# Patient Record
Sex: Female | Born: 1975 | Race: White | Hispanic: No | Marital: Single | Smoking: Current every day smoker
Health system: Southern US, Community
[De-identification: ages and names within clinical notes are randomized; demographics above are authoritative.]

## PROBLEM LIST (undated history)

## (undated) ENCOUNTER — Emergency Department (HOSPITAL_BASED_OUTPATIENT_CLINIC_OR_DEPARTMENT_OTHER): Admission: EM | Payer: Medicaid Other | Source: Home / Self Care

## (undated) DIAGNOSIS — G8929 Other chronic pain: Secondary | ICD-10-CM

## (undated) DIAGNOSIS — K219 Gastro-esophageal reflux disease without esophagitis: Secondary | ICD-10-CM

## (undated) DIAGNOSIS — M1990 Unspecified osteoarthritis, unspecified site: Secondary | ICD-10-CM

## (undated) DIAGNOSIS — M797 Fibromyalgia: Secondary | ICD-10-CM

## (undated) DIAGNOSIS — M549 Dorsalgia, unspecified: Secondary | ICD-10-CM

## (undated) DIAGNOSIS — G43909 Migraine, unspecified, not intractable, without status migrainosus: Secondary | ICD-10-CM

## (undated) HISTORY — PX: OTHER SURGICAL HISTORY: SHX169

## (undated) HISTORY — PX: TUBAL LIGATION: SHX77

## (undated) HISTORY — PX: APPENDECTOMY: SHX54

---

## 1998-01-10 ENCOUNTER — Emergency Department (HOSPITAL_COMMUNITY): Admission: EM | Admit: 1998-01-10 | Discharge: 1998-01-10 | Payer: Self-pay | Admitting: Emergency Medicine

## 1998-03-22 ENCOUNTER — Emergency Department (HOSPITAL_COMMUNITY): Admission: EM | Admit: 1998-03-22 | Discharge: 1998-03-22 | Payer: Self-pay | Admitting: Emergency Medicine

## 1998-06-04 ENCOUNTER — Emergency Department (HOSPITAL_COMMUNITY): Admission: EM | Admit: 1998-06-04 | Discharge: 1998-06-04 | Payer: Self-pay | Admitting: Emergency Medicine

## 1998-07-12 ENCOUNTER — Emergency Department (HOSPITAL_COMMUNITY): Admission: EM | Admit: 1998-07-12 | Discharge: 1998-07-13 | Payer: Self-pay | Admitting: Emergency Medicine

## 1998-07-13 ENCOUNTER — Encounter: Payer: Self-pay | Admitting: Family Medicine

## 1998-07-13 ENCOUNTER — Ambulatory Visit (HOSPITAL_COMMUNITY): Admission: RE | Admit: 1998-07-13 | Discharge: 1998-07-13 | Payer: Self-pay | Admitting: Family Medicine

## 1999-03-18 ENCOUNTER — Inpatient Hospital Stay (HOSPITAL_COMMUNITY): Admission: AD | Admit: 1999-03-18 | Discharge: 1999-03-18 | Payer: Self-pay | Admitting: Obstetrics

## 1999-09-19 ENCOUNTER — Emergency Department (HOSPITAL_COMMUNITY): Admission: EM | Admit: 1999-09-19 | Discharge: 1999-09-19 | Payer: Self-pay | Admitting: *Deleted

## 1999-09-24 ENCOUNTER — Emergency Department (HOSPITAL_COMMUNITY): Admission: EM | Admit: 1999-09-24 | Discharge: 1999-09-24 | Payer: Self-pay | Admitting: Emergency Medicine

## 1999-11-02 ENCOUNTER — Emergency Department (HOSPITAL_COMMUNITY): Admission: EM | Admit: 1999-11-02 | Discharge: 1999-11-02 | Payer: Self-pay | Admitting: Emergency Medicine

## 1999-11-17 ENCOUNTER — Emergency Department (HOSPITAL_COMMUNITY): Admission: EM | Admit: 1999-11-17 | Discharge: 1999-11-17 | Payer: Self-pay | Admitting: Emergency Medicine

## 2000-08-08 ENCOUNTER — Inpatient Hospital Stay (HOSPITAL_COMMUNITY): Admission: AD | Admit: 2000-08-08 | Discharge: 2000-08-08 | Payer: Self-pay | Admitting: *Deleted

## 2000-09-09 ENCOUNTER — Emergency Department (HOSPITAL_COMMUNITY): Admission: EM | Admit: 2000-09-09 | Discharge: 2000-09-09 | Payer: Self-pay | Admitting: *Deleted

## 2000-12-13 ENCOUNTER — Inpatient Hospital Stay (HOSPITAL_COMMUNITY): Admission: AD | Admit: 2000-12-13 | Discharge: 2000-12-13 | Payer: Self-pay | Admitting: Obstetrics

## 2018-04-09 ENCOUNTER — Emergency Department (HOSPITAL_BASED_OUTPATIENT_CLINIC_OR_DEPARTMENT_OTHER)
Admission: EM | Admit: 2018-04-09 | Discharge: 2018-04-09 | Disposition: A | Discharge status: Home / Self Care | Payer: Medicaid Other | Attending: Emergency Medicine | Admitting: Emergency Medicine

## 2018-04-09 ENCOUNTER — Encounter (HOSPITAL_BASED_OUTPATIENT_CLINIC_OR_DEPARTMENT_OTHER): Payer: Self-pay | Admitting: Emergency Medicine

## 2018-04-09 ENCOUNTER — Other Ambulatory Visit: Payer: Self-pay

## 2018-04-09 DIAGNOSIS — R519 Headache, unspecified: Secondary | ICD-10-CM

## 2018-04-09 DIAGNOSIS — R51 Headache: Secondary | ICD-10-CM | POA: Diagnosis not present

## 2018-04-09 DIAGNOSIS — R0981 Nasal congestion: Secondary | ICD-10-CM | POA: Diagnosis not present

## 2018-04-09 DIAGNOSIS — Z79899 Other long term (current) drug therapy: Secondary | ICD-10-CM | POA: Diagnosis not present

## 2018-04-09 DIAGNOSIS — F17210 Nicotine dependence, cigarettes, uncomplicated: Secondary | ICD-10-CM | POA: Diagnosis not present

## 2018-04-09 HISTORY — DX: Other chronic pain: G89.29

## 2018-04-09 HISTORY — DX: Migraine, unspecified, not intractable, without status migrainosus: G43.909

## 2018-04-09 HISTORY — DX: Dorsalgia, unspecified: M54.9

## 2018-04-09 MED ORDER — DIPHENHYDRAMINE HCL 25 MG PO CAPS
25.0000 mg | ORAL_CAPSULE | Freq: Once | ORAL | Status: AC
  Administered 2018-04-09: 25 mg via ORAL
  Filled 2018-04-09: qty 1

## 2018-04-09 MED ORDER — IBUPROFEN 400 MG PO TABS
600.0000 mg | ORAL_TABLET | Freq: Once | ORAL | Status: AC
  Administered 2018-04-09: 600 mg via ORAL
  Filled 2018-04-09: qty 1

## 2018-04-09 MED ORDER — ONDANSETRON 8 MG PO TBDP: 8.0000 mg | ORAL_TABLET | Freq: Three times a day (TID) | ORAL | 0 refills | Status: DC | PRN

## 2018-04-09 MED ORDER — IBUPROFEN 600 MG PO TABS: 600.0000 mg | ORAL_TABLET | Freq: Three times a day (TID) | ORAL | 0 refills | Status: AC | PRN

## 2018-04-09 NOTE — ED Notes (Signed)
Pt/family verbalized understanding of discharge instructions.   

## 2018-04-09 NOTE — ED Provider Notes (Signed)
**Note Beth-Identified via Obfuscation** [facility] EMERGENCY DEPARTMENT Provider Note   CSN: [id number] Arrival date & time: 04/09/18  0808     History   Chief Complaint Chief Complaint  Patient presents with  . Headache    HPI Beth Porter is a 42 y.o. female.  Patient with hx migraines, c/o onset dull frontal headache 2-3 days ago. Pain gradual onset, constant, persistent, moderate, feels like prior headaches. No acute or abrupt worsening today. +nausea. No vomiting. No eye pain or change in vision. No numbness/weakness. No change in speech. No change in normal functional ability. Denies any recent head trauma. No syncope. No fever or chills. +recent nasal/sinus congestion. No sore throat. Has not taken any meds today.   The history is provided by the patient.  Headache   Associated symptoms include nausea. Pertinent negatives include no fever, no shortness of breath and no vomiting.    Past Medical History:  Diagnosis Date  . Chronic back pain   . Migraine          OB History   None      Home Medications    Prior to Admission medications   Medication Sig Start Date End Date Taking? Authorizing Provider  HYDROcodone-acetaminophen (NORCO) 10-325 MG tablet Take by mouth. 03/30/18 04/29/18 Yes [provider]  naloxone Jonelle Sports(NARCAN) 2 MG/2ML injection For suspected opioid overdose, spray 1 mL in each nostril.  Repeat after 3 minutes if no or minimal response. 01/24/17  Yes [provider]  SUMAtriptan (IMITREX) 100 MG tablet Take by mouth. 01/09/17  Yes [provider]  morphine (MSIR) 30 MG tablet Take by mouth. 04/10/18 05/10/18  [provider]    Family History No family history on file.  Social History Social History   Tobacco Use  . Smoking status: Current Every Day Smoker  . Smokeless tobacco: Never Used  Substance Use Topics  . Alcohol use: Not on file  . Drug use: Not on file     Allergies   Aspirin and Metronidazole   Review of  Systems Review of Systems  Constitutional: Negative for fever.  HENT: Positive for congestion and rhinorrhea. Negative for sore throat.   Eyes: Negative for pain, redness and visual disturbance.  Respiratory: Negative for shortness of breath.   Cardiovascular: Negative for chest pain.  Gastrointestinal: Positive for nausea. Negative for abdominal pain and vomiting.  Genitourinary: Negative for flank pain.  Musculoskeletal: Negative for back pain, neck pain and neck stiffness.  Skin: Negative for rash.  Neurological: Positive for headaches. Negative for syncope.  Hematological: Does not bruise/bleed easily.  Psychiatric/Behavioral: Negative for confusion.     Physical Exam Updated Vital Signs BP (!) 158/93 (BP Location: Right Arm)   Pulse 85   Temp 98 F (36.7 C) (Oral)   Resp 18   Ht 1.473 m (4\' 10" )   Wt 52.6 kg   LMP 04/03/2018   SpO2 98%   BMI 24.24 kg/m   Physical Exam  Constitutional: She is oriented to person, place, and time. She appears well-developed and well-nourished.  HENT:  Head: Atraumatic.  Nose: Nose normal.  Mouth/Throat: Oropharynx is clear and moist.  No sinus or temporal tenderness. +nasal congestion.   Eyes: Pupils are equal, round, and reactive to light. Conjunctivae and EOM are normal. No scleral icterus.  Neck: Neck supple. No tracheal deviation present. No thyromegaly present.  No stiffness or rigidity.   Cardiovascular: Normal rate, regular rhythm, normal heart sounds and intact distal pulses. Exam reveals no gallop  and no friction rub.  No murmur heard. Pulmonary/Chest: Effort normal and breath sounds normal. No respiratory distress.  Abdominal: Soft. Normal appearance and bowel sounds are normal. She exhibits no distension. There is no tenderness.  Genitourinary:  Genitourinary Comments: No cva tenderness.  Musculoskeletal: Normal range of motion. She exhibits no edema or tenderness.  Neurological: She is alert and oriented to person, place,  and time. No cranial nerve deficit.  Speech clear/fluent. Motor/sens grossly intact bil. Steady gait.   Skin: Skin is warm and dry. No rash noted. She is not diaphoretic.  Psychiatric: She has a normal mood and affect.  Nursing note and vitals reviewed.    ED Treatments / Results  Labs (all labs ordered are listed, but only abnormal results are displayed) Labs Reviewed - No data to display  EKG None  Radiology No results found.  Procedures Procedures (including critical care time)  Medications Ordered in ED Medications  ibuprofen (ADVIL,MOTRIN) tablet 600 mg (600 mg Oral Given 04/09/18 0915)  diphenhydrAMINE (BENADRYL) capsule 25 mg (25 mg Oral Given 04/09/18 0916)     Initial Impression / Assessment and Plan / ED Course  I have reviewed the triage vital signs and the nursing notes.  Pertinent labs & imaging results that were available during my care of the patient were reviewed by me and considered in my medical decision making (see chart for details).  No meds yet today.  Motrin po. Benadryl po.  Reviewed nursing notes and prior charts for additional history.   Recheck, pt reports feeling much improved.   Pt currently appears stable for d/c.     Final Clinical Impressions(s) / ED Diagnoses   Final diagnoses:  None    ED Discharge Orders    None       Cathren Laine, MD 04/09/18 [phone]

## 2018-04-09 NOTE — ED Triage Notes (Signed)
Headache with nausea since Friday.  Took one dose Imitrex which helped some but still continues.  Pt states she is on pain contract and hasn't been able to take her pain medication due to nausea.

## 2018-04-09 NOTE — Discharge Instructions (Addendum)
It was our pleasure to provide your ER care today - we hope that you feel better.  Take motrin as need for pain - take with food. Take zofran as need for nausea.  For sinus congestion - take zyrtec-d or claritin-d as need (available over the counter).  Follow up with primary care doctor in 1 week if symptoms fail to improve/resolve.

## 2018-04-14 ENCOUNTER — Other Ambulatory Visit: Payer: Self-pay

## 2018-04-14 ENCOUNTER — Encounter (HOSPITAL_BASED_OUTPATIENT_CLINIC_OR_DEPARTMENT_OTHER): Payer: Self-pay | Admitting: Emergency Medicine

## 2018-04-14 ENCOUNTER — Emergency Department (HOSPITAL_BASED_OUTPATIENT_CLINIC_OR_DEPARTMENT_OTHER)
Admission: EM | Admit: 2018-04-14 | Discharge: 2018-04-14 | Disposition: A | Discharge status: Home / Self Care | Payer: Medicaid Other | Attending: Emergency Medicine | Admitting: Emergency Medicine

## 2018-04-14 DIAGNOSIS — F17200 Nicotine dependence, unspecified, uncomplicated: Secondary | ICD-10-CM | POA: Diagnosis not present

## 2018-04-14 DIAGNOSIS — J069 Acute upper respiratory infection, unspecified: Secondary | ICD-10-CM | POA: Diagnosis not present

## 2018-04-14 DIAGNOSIS — H9201 Otalgia, right ear: Secondary | ICD-10-CM | POA: Diagnosis present

## 2018-04-14 DIAGNOSIS — Z79899 Other long term (current) drug therapy: Secondary | ICD-10-CM | POA: Diagnosis not present

## 2018-04-14 MED ORDER — AEROCHAMBER PLUS FLO-VU MEDIUM MISC
1.0000 | Freq: Once | Status: AC
  Administered 2018-04-14: 1
  Filled 2018-04-14: qty 1

## 2018-04-14 MED ORDER — NEOMYCIN-POLYMYXIN-HC 3.5-10000-1 OT SUSP: 4.0000 [drp] | Freq: Three times a day (TID) | OTIC | 0 refills | Status: DC

## 2018-04-14 MED ORDER — ALBUTEROL SULFATE HFA 108 (90 BASE) MCG/ACT IN AERS
1.0000 | INHALATION_SPRAY | Freq: Once | RESPIRATORY_TRACT | Status: AC
  Administered 2018-04-14: 1 via RESPIRATORY_TRACT
  Filled 2018-04-14: qty 6.7

## 2018-04-14 MED ORDER — FLUTICASONE PROPIONATE 50 MCG/ACT NA SUSP: 1.0000 | Freq: Every day | NASAL | 2 refills | Status: DC

## 2018-04-14 MED ORDER — BENZONATATE 100 MG PO CAPS: 200.0000 mg | ORAL_CAPSULE | Freq: Three times a day (TID) | ORAL | 0 refills | Status: DC

## 2018-04-14 NOTE — ED Notes (Signed)
Patient verbalizes understanding of discharge instructions. Opportunity for questioning and answers were provided. Armband removed by staff, pt discharged from ED to her home via POV.  

## 2018-04-14 NOTE — ED Provider Notes (Signed)
[facility] EMERGENCY DEPARTMENT Provider Note   CSN: [id number] Arrival date & time: 04/14/18  1757     History   Chief Complaint Chief Complaint  Patient presents with  . Otalgia    HPI Beth Porter is a 42 y.o. female who presents to ED for evaluation of 1 day history of right-sided otalgia.  She also reports several day history of sinus congestion, dry cough, mild wheezing.  States that she experiences similar symptoms around this time of year or several times a year.  In the past she has had to use an inhaler to help with her wheezing.  She has a history of a ruptured TM on the right side several years ago due to a viral infection.  She denies any chest pain, shortness of breath, hemoptysis, fever, drainage from the ear, recent swimming, trauma to the area.  HPI  Past Medical History:  Diagnosis Date  . Chronic back pain   . Migraine     There are no active problems to display for this patient.   Past Surgical History:  Procedure Laterality Date  . abdominal laproscopy x 2    . APPENDECTOMY    . CESAREAN SECTION     x 3  . TUBAL LIGATION       OB History   None      Home Medications    Prior to Admission medications   Medication Sig Start Date End Date Taking? Authorizing Provider  benzonatate (TESSALON) 100 MG capsule Take 2 capsules (200 mg total) by mouth every 8 (eight) hours. 04/14/18   Ross Bender, PA-C  fluticasone (FLONASE) 50 MCG/ACT nasal spray Place 1 spray into both nostrils daily. 04/14/18   Flavius Repsher, PA-C  HYDROcodone-acetaminophen (NORCO) 10-325 MG tablet Take by mouth. 03/30/18 04/29/18  [provider]  ibuprofen (ADVIL,MOTRIN) 600 MG tablet Take 1 tablet (600 mg total) by mouth every 8 (eight) hours as needed. Take with food. 04/09/18   Cathren Laine, MD  morphine (MSIR) 30 MG tablet Take by mouth. 04/10/18 05/10/18  [provider]  naloxone Jonelle Sports) 2 MG/2ML injection For suspected opioid overdose, spray 1 mL  in each nostril.  Repeat after 3 minutes if no or minimal response. 01/24/17   [provider]  neomycin-polymyxin-hydrocortisone (CORTISPORIN) 3.5-10000-1 OTIC suspension Place 4 drops into the right ear 3 (three) times daily. 04/14/18   Shayon Trompeter, PA-C  ondansetron (ZOFRAN ODT) 8 MG disintegrating tablet Take 1 tablet (8 mg total) by mouth every 8 (eight) hours as needed for nausea or vomiting. 04/09/18   Cathren Laine, MD  SUMAtriptan (IMITREX) 100 MG tablet Take by mouth. 01/09/17   [provider]    Family History No family history on file.  Social History Social History   Tobacco Use  . Smoking status: Current Every Day Smoker  . Smokeless tobacco: Never Used  Substance Use Topics  . Alcohol use: Yes  . Drug use: Never     Allergies   Aspirin and Metronidazole   Review of Systems Review of Systems  Constitutional: Negative for chills and fever.  HENT: Positive for ear pain, postnasal drip, rhinorrhea and sinus pressure. Negative for ear discharge, facial swelling, sinus pain, sore throat, tinnitus and trouble swallowing.   Respiratory: Positive for cough and wheezing. Negative for shortness of breath.   Cardiovascular: Negative for chest pain.     Physical Exam Updated Vital Signs BP (!) 135/93   Pulse 86   Temp 98.5 F (36.9  C) (Oral)   Resp 18   Ht 4\' 10"  (1.473 m)   Wt 52.6 kg   LMP 04/03/2018   SpO2 96%   BMI 24.24 kg/m   Physical Exam  Constitutional: She appears well-developed and well-nourished. No distress.  HENT:  Head: Normocephalic and atraumatic.  Right Ear: A middle ear effusion is present.  Left Ear: A middle ear effusion is present.  Nose: Nose normal.  Mouth/Throat: Uvula is midline and oropharynx is clear and moist.  Eyes: Conjunctivae and EOM are normal. No scleral icterus.  Neck: Normal range of motion.  Cardiovascular: Normal rate and regular rhythm.  Pulmonary/Chest: Effort normal. No respiratory distress. She has  wheezes (Faint expiratory wheezing noted in bilateral lung fields.).  Neurological: She is alert.  Skin: No rash noted. She is not diaphoretic.  Psychiatric: She has a normal mood and affect.  Nursing note and vitals reviewed.    ED Treatments / Results  Labs (all labs ordered are listed, but only abnormal results are displayed) Labs Reviewed - No data to display  EKG None  Radiology No results found.  Procedures Procedures (including critical care time)  Medications Ordered in ED Medications  albuterol (PROVENTIL HFA;VENTOLIN HFA) 108 (90 Base) MCG/ACT inhaler 1 puff (1 puff Inhalation Given 04/14/18 1922)  AEROCHAMBER PLUS FLO-VU MEDIUM MISC 1 each (1 each Other Given 04/14/18 1923)     Initial Impression / Assessment and Plan / ED Course  I have reviewed the triage vital signs and the nursing notes.  Pertinent labs & imaging results that were available during my care of the patient were reviewed by me and considered in my medical decision making (see chart for details).     42 year old female presents to ED for 1 day history of right-sided otalgia.  He also reports several day history of dry cough, nasal congestion, wheezing.  She endorses similar symptoms around this time of year or several times a year.  On exam there is bilateral TM effusions noted with no other concerning findings.  Faint wheezing noted in bilateral lung fields.  Patient states is usually improved with a few puffs of her inhaler.  Denies any fever, chest pain, shortness of breath, and is from the ear.  Will treat for viral URI and given inhaler here.  Cortisporin drops as needed for discomfort advised to return to ED for any severe worsening symptoms.  Portions of this note were generated with Scientist, clinical (histocompatibility and immunogenetics). Dictation errors may occur despite best attempts at proofreading.   Final Clinical Impressions(s) / ED Diagnoses   Final diagnoses:  Viral upper respiratory tract infection    ED  Discharge Orders         Ordered    neomycin-polymyxin-hydrocortisone (CORTISPORIN) 3.5-10000-1 OTIC suspension  3 times daily     04/14/18 1920    benzonatate (TESSALON) 100 MG capsule  Every 8 hours     04/14/18 1920    fluticasone (FLONASE) 50 MCG/ACT nasal spray  Daily     04/14/18 1920           Dietrich Pates, PA-C 04/14/18 1926    Tegeler, Canary Brim, MD 04/15/18 [phone]

## 2018-04-14 NOTE — ED Triage Notes (Signed)
Pt c/o B/L ear pain R>L onset today. Pt reports sinus issues for past week. Pt reports has a history of ruptured ear drum in right ear. Pt has tried over the counter medication without relief.

## 2018-04-14 NOTE — Discharge Instructions (Signed)
Return to ED for worsening symptoms, trouble breathing or trouble swallowing, chest pain, vomiting or coughing up blood. °

## 2018-11-11 ENCOUNTER — Other Ambulatory Visit: Payer: Self-pay

## 2018-11-11 ENCOUNTER — Emergency Department (HOSPITAL_BASED_OUTPATIENT_CLINIC_OR_DEPARTMENT_OTHER): Payer: Medicaid Other

## 2018-11-11 ENCOUNTER — Encounter (HOSPITAL_BASED_OUTPATIENT_CLINIC_OR_DEPARTMENT_OTHER): Payer: Self-pay | Admitting: *Deleted

## 2018-11-11 ENCOUNTER — Emergency Department (HOSPITAL_BASED_OUTPATIENT_CLINIC_OR_DEPARTMENT_OTHER)
Admission: EM | Admit: 2018-11-11 | Discharge: 2018-11-11 | Disposition: A | Discharge status: Home / Self Care | Payer: Medicaid Other | Attending: Emergency Medicine | Admitting: Emergency Medicine

## 2018-11-11 DIAGNOSIS — F17210 Nicotine dependence, cigarettes, uncomplicated: Secondary | ICD-10-CM | POA: Insufficient documentation

## 2018-11-11 DIAGNOSIS — G894 Chronic pain syndrome: Secondary | ICD-10-CM | POA: Insufficient documentation

## 2018-11-11 DIAGNOSIS — R112 Nausea with vomiting, unspecified: Secondary | ICD-10-CM

## 2018-11-11 DIAGNOSIS — Z79899 Other long term (current) drug therapy: Secondary | ICD-10-CM | POA: Insufficient documentation

## 2018-11-11 DIAGNOSIS — R110 Nausea: Secondary | ICD-10-CM | POA: Diagnosis present

## 2018-11-11 DIAGNOSIS — R1011 Right upper quadrant pain: Secondary | ICD-10-CM | POA: Diagnosis not present

## 2018-11-11 HISTORY — DX: Gastro-esophageal reflux disease without esophagitis: K21.9

## 2018-11-11 LAB — COMPREHENSIVE METABOLIC PANEL
ALT: 20 U/L (ref 0–44)
AST: 16 U/L (ref 15–41)
Albumin: 4.4 g/dL (ref 3.5–5.0)
Alkaline Phosphatase: 88 U/L (ref 38–126)
Anion gap: 7 (ref 5–15)
BUN: 10 mg/dL (ref 6–20)
CO2: 22 mmol/L (ref 22–32)
Calcium: 9.1 mg/dL (ref 8.9–10.3)
Chloride: 108 mmol/L (ref 98–111)
Creatinine, Ser: 0.6 mg/dL (ref 0.44–1.00)
GFR calc Af Amer: >60 mL/min (ref >60)
GFR calc non Af Amer: >60 mL/min (ref >60)
Glucose, Bld: 130 mg/dL — ABNORMAL HIGH (ref 70–99)
Potassium: 3.8 mmol/L (ref 3.5–5.1)
Sodium: 137 mmol/L (ref 135–145)
Total Bilirubin: 0.3 mg/dL (ref 0.3–1.2)
Total Protein: 8.1 g/dL (ref 6.5–8.1)

## 2018-11-11 LAB — CBC WITH DIFFERENTIAL/PLATELET
Abs Immature Granulocytes: 0.06 10*3/uL (ref 0.00–0.07)
Basophils Absolute: 0.1 10*3/uL (ref 0.0–0.1)
Basophils Relative: 0 %
Eosinophils Absolute: 0.1 10*3/uL (ref 0.0–0.5)
Eosinophils Relative: 1 %
HCT: 48.4 % — ABNORMAL HIGH (ref 36.0–46.0)
Hemoglobin: 16 g/dL — ABNORMAL HIGH (ref 12.0–15.0)
Immature Granulocytes: 0 %
Lymphocytes Relative: 26 %
Lymphs Abs: 3.7 10*3/uL (ref 0.7–4.0)
MCH: 28.1 pg (ref 26.0–34.0)
MCHC: 33.1 g/dL (ref 30.0–36.0)
MCV: 84.9 fL (ref 80.0–100.0)
Monocytes Absolute: 0.6 10*3/uL (ref 0.1–1.0)
Monocytes Relative: 4 %
Neutro Abs: 9.9 10*3/uL — ABNORMAL HIGH (ref 1.7–7.7)
Neutrophils Relative %: 69 %
Platelets: 427 10*3/uL — ABNORMAL HIGH (ref 150–400)
RBC: 5.7 MIL/uL — ABNORMAL HIGH (ref 3.87–5.11)
RDW: 14 % (ref 11.5–15.5)
WBC: 14.4 10*3/uL — ABNORMAL HIGH (ref 4.0–10.5)
nRBC: 0 % (ref 0.0–0.2)

## 2018-11-11 LAB — LIPASE, BLOOD: Lipase: 33 U/L (ref 11–51)

## 2018-11-11 MED ORDER — PANTOPRAZOLE SODIUM 40 MG IV SOLR
40.0000 mg | Freq: Once | INTRAVENOUS | Status: AC
  Administered 2018-11-11: 40 mg via INTRAVENOUS
  Filled 2018-11-11: qty 40

## 2018-11-11 MED ORDER — MORPHINE SULFATE (PF) 4 MG/ML IV SOLN
6.0000 mg | Freq: Once | INTRAVENOUS | Status: AC
  Administered 2018-11-11: 6 mg via INTRAVENOUS
  Filled 2018-11-11: qty 2

## 2018-11-11 MED ORDER — ALUM & MAG HYDROXIDE-SIMETH 200-200-20 MG/5ML PO SUSP
30.0000 mL | Freq: Once | ORAL | Status: AC
  Administered 2018-11-11: 30 mL via ORAL
  Filled 2018-11-11: qty 30

## 2018-11-11 MED ORDER — PROMETHAZINE HCL 25 MG PO TABS
25.0000 mg | ORAL_TABLET | Freq: Once | ORAL | Status: AC
  Administered 2018-11-11: 17:00:00 25 mg via ORAL
  Filled 2018-11-11: qty 1

## 2018-11-11 MED ORDER — SODIUM CHLORIDE 0.9 % IV BOLUS
500.0000 mL | Freq: Once | INTRAVENOUS | Status: AC
  Administered 2018-11-11: 15:00:00 500 mL via INTRAVENOUS

## 2018-11-11 MED ORDER — ONDANSETRON HCL 4 MG/2ML IJ SOLN
4.0000 mg | Freq: Once | INTRAMUSCULAR | Status: AC
  Administered 2018-11-11: 15:00:00 4 mg via INTRAVENOUS
  Filled 2018-11-11: qty 2

## 2018-11-11 MED ORDER — PANTOPRAZOLE SODIUM 40 MG PO TBEC: 40.0000 mg | DELAYED_RELEASE_TABLET | Freq: Every day | ORAL | 0 refills | Status: DC

## 2018-11-11 MED ORDER — LIDOCAINE VISCOUS HCL 2 % MT SOLN
15.0000 mL | Freq: Once | OROMUCOSAL | Status: AC
  Administered 2018-11-11: 15:00:00 15 mL via ORAL
  Filled 2018-11-11: qty 15

## 2018-11-11 MED ORDER — MORPHINE SULFATE (PF) 4 MG/ML IV SOLN
4.0000 mg | Freq: Once | INTRAVENOUS | Status: DC
  Filled 2018-11-11: qty 1

## 2018-11-11 MED ORDER — PROMETHAZINE HCL 25 MG PO TABS: 25.0000 mg | ORAL_TABLET | Freq: Four times a day (QID) | ORAL | 0 refills | Status: DC | PRN

## 2018-11-11 MED ORDER — SUCRALFATE 1 GM/10ML PO SUSP: 1.0000 g | Freq: Three times a day (TID) | ORAL | 0 refills | Status: DC

## 2018-11-11 NOTE — ED Notes (Signed)
Pt states she is going thru withdrawal because she is unable to take pain medication d/tt nausea that she states is r/t having GERD.

## 2018-11-11 NOTE — ED Provider Notes (Signed)
[facility] EMERGENCY DEPARTMENT Provider Note   CSN: [id number] Arrival date & time: 11/11/18  1338    History   Chief Complaint Chief Complaint  Patient presents with  . Nausea    HPI Beth Porter is a 43 y.o. female with history of chronic back pain, GERD who presents with nausea and intermittent vomiting for the past 4 days.  Patient reports she has had this in the past related to her GERD.  She does not currently take any medication for GERD.  She has not been able to tolerate her oral pain medications of morphine and hydrocodone.  She denies any significant abdominal pain, chest pain, shortness of breath, diarrhea, fever.  Patient reports she has had her associated chronic back and hip pain that has become worse because she cannot take her pain medication.  She called her doctor who was unable to call anything in due to a prior authorization.  Patient denies any saddle anesthesia, bowel/bladder incontinence, history of IVDU, known cancer, recent spinal injection.     HPI  Past Medical History:  Diagnosis Date  . Chronic back pain   . GERD (gastroesophageal reflux disease)   . Migraine     There are no active problems to display for this patient.   Past Surgical History:  Procedure Laterality Date  . abdominal laproscopy x 2    . APPENDECTOMY    . CESAREAN SECTION     x 3  . TUBAL LIGATION       OB History   No obstetric history on file.      Home Medications    Prior to Admission medications   Medication Sig Start Date End Date Taking? Authorizing Provider  HYDROcodone-acetaminophen (NORCO) 10-325 MG tablet Take by mouth. 10/21/18 11/20/18 Yes [provider]  benzonatate (TESSALON) 100 MG capsule Take 2 capsules (200 mg total) by mouth every 8 (eight) hours. 04/14/18   Khatri, Hina, PA-C  fluticasone (FLONASE) 50 MCG/ACT nasal spray Place 1 spray into both nostrils daily. 04/14/18   Khatri, Hina, PA-C  ibuprofen (ADVIL,MOTRIN) 600 MG tablet  Take 1 tablet (600 mg total) by mouth every 8 (eight) hours as needed. Take with food. 04/09/18   Cathren Laine, MD  morphine (KADIAN) 50 MG 24 hr capsule Take by mouth.    [provider]  naloxone Jonelle Sports) 2 MG/2ML injection For suspected opioid overdose, spray 1 mL in each nostril.  Repeat after 3 minutes if no or minimal response. 01/24/17   [provider]  neomycin-polymyxin-hydrocortisone (CORTISPORIN) 3.5-10000-1 OTIC suspension Place 4 drops into the right ear 3 (three) times daily. 04/14/18   Khatri, Hina, PA-C  ondansetron (ZOFRAN ODT) 8 MG disintegrating tablet Take 1 tablet (8 mg total) by mouth every 8 (eight) hours as needed for nausea or vomiting. 04/09/18   Cathren Laine, MD  pantoprazole (PROTONIX) 40 MG tablet Take 1 tablet (40 mg total) by mouth daily. 11/11/18   Laurian Edrington, Waylan Boga, PA-C  promethazine (PHENERGAN) 25 MG tablet Take 1 tablet (25 mg total) by mouth every 6 (six) hours as needed for nausea or vomiting. 11/11/18   Kol Consuegra, Waylan Boga, PA-C  sucralfate (CARAFATE) 1 GM/10ML suspension Take 10 mLs (1 g total) by mouth 4 (four) times daily -  with meals and at bedtime. 11/11/18   Emi Holes, PA-C  SUMAtriptan (IMITREX) 100 MG tablet Take by mouth. 01/09/17   [provider]    Family History No family history on file.  Social  History Social History   Tobacco Use  . Smoking status: Current Every Day Smoker    Types: Cigarettes  . Smokeless tobacco: Never Used  Substance Use Topics  . Alcohol use: Yes    Comment: Very Rare  . Drug use: Never     Allergies   Aspirin and Metronidazole   Review of Systems Review of Systems  Constitutional: Negative for chills and fever.  HENT: Negative for facial swelling and sore throat.   Respiratory: Negative for shortness of breath.   Cardiovascular: Negative for chest pain.  Gastrointestinal: Positive for nausea and vomiting (once or twice). Negative for abdominal pain and diarrhea.   Genitourinary: Negative for dysuria.  Musculoskeletal: Positive for back pain (chronic).  Skin: Negative for rash and wound.  Neurological: Negative for headaches.  Psychiatric/Behavioral: The patient is not nervous/anxious.      Physical Exam Updated Vital Signs BP (!) 141/93   Pulse 78   Temp 98.4 F (36.9 C) (Oral)   Resp 18   Ht  (1.473 m)   Wt 56.7 kg   LMP 10/31/2018 (Approximate)   SpO2 98%   BMI 26.13 kg/m   Physical Exam Vitals signs and nursing note reviewed.  Constitutional:      General: She is not in acute distress.    Appearance: She is well-developed. She is not diaphoretic.  HENT:     Head: Normocephalic and atraumatic.     Mouth/Throat:     Pharynx: No oropharyngeal exudate.  Eyes:     General: No scleral icterus.       Right eye: No discharge.        Left eye: No discharge.     Conjunctiva/sclera: Conjunctivae normal.     Pupils: Pupils are equal, round, and reactive to light.  Neck:     Musculoskeletal: Normal range of motion and neck supple.     Thyroid: No thyromegaly.  Cardiovascular:     Rate and Rhythm: Normal rate and regular rhythm.     Heart sounds: Normal heart sounds. No murmur. No friction rub. No gallop.   Pulmonary:     Effort: Pulmonary effort is normal. No respiratory distress.     Breath sounds: Normal breath sounds. No stridor. No wheezing or rales.  Abdominal:     General: Bowel sounds are normal. There is no distension.     Palpations: Abdomen is soft.     Tenderness: There is abdominal tenderness in the right upper quadrant and epigastric area. There is no guarding or rebound. Negative signs include Murphy's sign and McBurney's sign.  Musculoskeletal:     Comments: Very tender on the lumbar spine and bilateral paraspinal muscles, however this is baseline; no erythema  Lymphadenopathy:     Cervical: No cervical adenopathy.  Skin:    General: Skin is warm and dry.     Coloration: Skin is not pale.     Findings: No  rash.  Neurological:     Mental Status: She is alert.     Coordination: Coordination normal.      ED Treatments / Results  Labs (all labs ordered are listed, but only abnormal results are displayed) Labs Reviewed  COMPREHENSIVE METABOLIC PANEL - Abnormal; Notable for the following components:      Result Value   Glucose, Bld 130 (*)    All other components within normal limits  CBC WITH DIFFERENTIAL/PLATELET - Abnormal; Notable for the following components:   WBC 14.4 (*)    RBC 5.70 (*)  Hemoglobin 16.0 (*)    HCT 48.4 (*)    Platelets 427 (*)    Neutro Abs 9.9 (*)    All other components within normal limits  LIPASE, BLOOD    EKG None  Radiology US Abdomen Limited Ruq  Result Date: 11/11/2018 CLINICAL DATA:  Right upper quadrant pain with nausea EXAM: ULTRASOUND ABDOMEN LIMITED RIGHT UPPER QUADRANT COMPARISON:  None. FINDINGS: Gallbladder: No gallstones or wall thickening visualized. No sonographic Murphy sign noted by sonographer. Common bile duct: Diameter: 3 mm Liver: No focal lesion identified. Within normal limits in parenchymal echogenicity. Portal vein is patent on color Doppler imaging with normal direction of blood flow towards the liver. IMPRESSION: Negative right upper quadrant abdominal ultrasound Electronically Signed   By: Jasmine Pang M.D.   On: 11/11/2018 17:58    Procedures Procedures (including critical care time)  Medications Ordered in ED Medications  morphine 4 MG/ML injection 6 mg (has no administration in time range)  sodium chloride 0.9 % bolus 500 mL (0 mLs Intravenous Stopped 11/11/18 1651)  ondansetron (ZOFRAN) injection 4 mg (4 mg Intravenous Given 11/11/18 1526)  alum & mag hydroxide-simeth (MAALOX/MYLANTA) 200-200-20 MG/5ML suspension 30 mL (30 mLs Oral Given 11/11/18 1526)    And  lidocaine (XYLOCAINE) 2 % viscous mouth solution 15 mL (15 mLs Oral Given 11/11/18 1526)  pantoprazole (PROTONIX) injection 40 mg (40 mg Intravenous Given  11/11/18 1526)  morphine 4 MG/ML injection 6 mg (6 mg Intravenous Given 11/11/18 1527)  promethazine (PHENERGAN) tablet 25 mg (25 mg Oral Given 11/11/18 1649)     Initial Impression / Assessment and Plan / ED Course  I have reviewed the triage vital signs and the nursing notes.  Pertinent labs & imaging results that were available during my care of the patient were reviewed by me and considered in my medical decision making (see chart for details).        Patient presenting with a four-day history of nausea and intermittent vomiting.  She has had a burning sensation in her epigastrium. She has very mild tenderness in epigastrium and right upper quadrant.  She has not been able to tolerate her home pain medications.  Labs are unremarkable except for mild leukocytosis of 14.4, however I feel like some of this is hemoconcentrated as hemoglobin is 16.  Right upper quadrant ultrasound is negative.  Patient is afebrile and there are no signs of infection.   Patient reports she has had this happen before related to her GERD.  She is not currently taking anything for her GERD.  Will restart Protonix as well as Carafate.  Patient had better relief with Phenergan and Zofran in the ED, so will discharge home with Phenergan.  Patient able to tolerate oral fluids prior to discharge.  Patient's back pain is baseline and treated with IV morphine in the ED.  Patient has plans to follow-up with her doctor tomorrow.  I also recommended maybe following up with GI, as patient has not had an endoscopy and could have an ulcer as cause of this pain as well.  Return precautions discussed.  Patient understands and agrees with plan.  Patient vital stable throughout ED course and discharged in satisfactory condition.  Final Clinical Impressions(s) / ED Diagnoses   Final diagnoses:  RUQ pain  Nausea & vomiting  Chronic pain syndrome    ED Discharge Orders         Ordered    promethazine (PHENERGAN) 25 MG tablet  Every 6  hours PRN  11/11/18 1806    pantoprazole (PROTONIX) 40 MG tablet  Daily     11/11/18 1806    sucralfate (CARAFATE) 1 GM/10ML suspension  3 times daily with meals & bedtime     11/11/18 1806           Emi HolesLaw, Onetta Spainhower M, PA-C 11/11/18 1809    Tegeler, Canary Brimhristopher J, MD 11/11/18 [phone]

## 2018-11-11 NOTE — Discharge Instructions (Signed)
Resume taking Protonix daily.  Take Carafate as prescribed before eating and before bed.  Take Phenergan every 6 hours as needed for nausea or vomiting.  Please follow-up with your doctor tomorrow as planned.  If this continues, it may be helpful to be referred to a GI doctor for an endoscopy.  Please return to emergency department if you develop any new or worsening symptoms including intractable vomiting, fever of 100.4, localized abdominal pain, black or bloody stools, or any other new or concerning symptoms.

## 2018-11-11 NOTE — ED Notes (Signed)
Pt verbalizes understanding that she has to have a ride before discharge d/t administration of pain medication

## 2018-11-11 NOTE — ED Triage Notes (Signed)
Pt reports nausea since Thursday. She has a history of GERD and chronic pain. States she has not been able to take her meds due to this and is having withdrawal symptoms. She spoke to her pain management doctor who directed her to come to ED

## 2018-12-13 ENCOUNTER — Other Ambulatory Visit: Payer: Self-pay

## 2018-12-13 ENCOUNTER — Emergency Department (HOSPITAL_BASED_OUTPATIENT_CLINIC_OR_DEPARTMENT_OTHER)
Admission: EM | Admit: 2018-12-13 | Discharge: 2018-12-13 | Disposition: A | Discharge status: Home / Self Care | Payer: Medicaid Other | Attending: Emergency Medicine | Admitting: Emergency Medicine

## 2018-12-13 ENCOUNTER — Encounter (HOSPITAL_BASED_OUTPATIENT_CLINIC_OR_DEPARTMENT_OTHER): Payer: Self-pay | Admitting: *Deleted

## 2018-12-13 DIAGNOSIS — W182XXA Fall in (into) shower or empty bathtub, initial encounter: Secondary | ICD-10-CM | POA: Diagnosis not present

## 2018-12-13 DIAGNOSIS — F17210 Nicotine dependence, cigarettes, uncomplicated: Secondary | ICD-10-CM | POA: Diagnosis not present

## 2018-12-13 DIAGNOSIS — Z79899 Other long term (current) drug therapy: Secondary | ICD-10-CM | POA: Insufficient documentation

## 2018-12-13 DIAGNOSIS — Y93E1 Activity, personal bathing and showering: Secondary | ICD-10-CM | POA: Diagnosis not present

## 2018-12-13 DIAGNOSIS — M7918 Myalgia, other site: Secondary | ICD-10-CM | POA: Insufficient documentation

## 2018-12-13 DIAGNOSIS — Y92009 Unspecified place in unspecified non-institutional (private) residence as the place of occurrence of the external cause: Secondary | ICD-10-CM

## 2018-12-13 DIAGNOSIS — Y92002 Bathroom of unspecified non-institutional (private) residence single-family (private) house as the place of occurrence of the external cause: Secondary | ICD-10-CM | POA: Insufficient documentation

## 2018-12-13 DIAGNOSIS — Y999 Unspecified external cause status: Secondary | ICD-10-CM | POA: Insufficient documentation

## 2018-12-13 DIAGNOSIS — M79604 Pain in right leg: Secondary | ICD-10-CM | POA: Diagnosis present

## 2018-12-13 DIAGNOSIS — W19XXXA Unspecified fall, initial encounter: Secondary | ICD-10-CM

## 2018-12-13 MED ORDER — METHOCARBAMOL 500 MG PO TABS: 500.0000 mg | ORAL_TABLET | Freq: Two times a day (BID) | ORAL | 0 refills | Status: DC

## 2018-12-13 MED ORDER — KETOROLAC TROMETHAMINE 30 MG/ML IJ SOLN
30.0000 mg | Freq: Once | INTRAMUSCULAR | Status: AC
  Administered 2018-12-13: 30 mg via INTRAMUSCULAR
  Filled 2018-12-13: qty 1

## 2018-12-13 MED ORDER — DIAZEPAM 5 MG PO TABS
5.0000 mg | ORAL_TABLET | Freq: Once | ORAL | Status: AC
  Administered 2018-12-13: 5 mg via ORAL
  Filled 2018-12-13: qty 1

## 2018-12-13 NOTE — ED Notes (Signed)
ED Provider at bedside. 

## 2018-12-13 NOTE — ED Triage Notes (Signed)
She slipped getting out of the shower this am. She fell at 1:30 while sitting on the edge of the bathtub. States she is in pain management and took her pain medications after the fall. Body aches throughout the day. States she cant take her pain medication for almost 2 hours.

## 2018-12-13 NOTE — ED Provider Notes (Signed)
[facility] EMERGENCY DEPARTMENT Provider Note   CSN: [id number] Arrival date & time: 12/13/18  2053    History   Chief Complaint Chief Complaint  Patient presents with  . Fall    HPI Beth Porter is a 43 y.o. female.     Patient with history of chronic pain and occasional mobility problems. She reports slipping in the bath tub twice today. She struck on her right leg on the tub ledge on one fall, and landed on her buttocks for the second fall. No loss of consciousness. She did not strike her head. She is reporting stiffness of the neck and back, as well as pain to the right buttock and leg. She has taken her chronic pain medication with intermittent relief, but it seems to wear off quickly.   The history is provided by the patient. No language interpreter was used.  Fall  This is a new problem. The current episode started 12 to 24 hours ago. The problem has been gradually worsening.    Past Medical History:  Diagnosis Date  . Chronic back pain   . GERD (gastroesophageal reflux disease)   . Migraine     There are no active problems to display for this patient.   Past Surgical History:  Procedure Laterality Date  . abdominal laproscopy x 2    . APPENDECTOMY    . CESAREAN SECTION     x 3  . TUBAL LIGATION       OB History   No obstetric history on file.      Home Medications    Prior to Admission medications   Medication Sig Start Date End Date Taking? Authorizing Provider  ibuprofen (ADVIL,MOTRIN) 600 MG tablet Take 1 tablet (600 mg total) by mouth every 8 (eight) hours as needed. Take with food. 04/09/18  Yes Cathren Laine, MD  morphine (KADIAN) 50 MG 24 hr capsule Take by mouth.   Yes [provider]  naloxone [facility]) 2 MG/2ML injection For suspected opioid overdose, spray 1 mL in each nostril.  Repeat after 3 minutes if no or minimal response. 01/24/17  Yes [provider]  pantoprazole (PROTONIX) 40 MG tablet Take 1 tablet (40  mg total) by mouth daily. 11/11/18  Yes Law, Waylan Boga, PA-C  SUMAtriptan (IMITREX) 100 MG tablet Take by mouth. 01/09/17  Yes [provider]  benzonatate (TESSALON) 100 MG capsule Take 2 capsules (200 mg total) by mouth every 8 (eight) hours. 04/14/18   Khatri, Hina, PA-C  fluticasone (FLONASE) 50 MCG/ACT nasal spray Place 1 spray into both nostrils daily. 04/14/18   Khatri, Hina, PA-C  neomycin-polymyxin-hydrocortisone (CORTISPORIN) 3.5-10000-1 OTIC suspension Place 4 drops into the right ear 3 (three) times daily. 04/14/18   Khatri, Hina, PA-C  ondansetron (ZOFRAN ODT) 8 MG disintegrating tablet Take 1 tablet (8 mg total) by mouth every 8 (eight) hours as needed for nausea or vomiting. 04/09/18   Cathren Laine, MD  promethazine (PHENERGAN) 25 MG tablet Take 1 tablet (25 mg total) by mouth every 6 (six) hours as needed for nausea or vomiting. 11/11/18   Law, Waylan Boga, PA-C  sucralfate (CARAFATE) 1 GM/10ML suspension Take 10 mLs (1 g total) by mouth 4 (four) times daily -  with meals and at bedtime. 11/11/18   Emi Holes, PA-C    Family History No family history on file.  Social History Social History   Tobacco Use  . Smoking status: Current Every Day Smoker    Types: Cigarettes  .  Smokeless tobacco: Never Used  Substance Use Topics  . Alcohol use: Yes    Comment: Very Rare  . Drug use: Never     Allergies   Aspirin and Metronidazole   Review of Systems Review of Systems  Musculoskeletal: Positive for arthralgias, back pain, myalgias and neck stiffness.  All other systems reviewed and are negative.    Physical Exam Updated Vital Signs BP (!) 142/94   Pulse (!) 104   Temp 98.1 F (36.7 C) (Oral)   Resp 18   Ht 4\' 10"  (1.473 m)   Wt 53.5 kg   SpO2 98%   BMI 24.66 kg/m   Physical Exam Vitals signs and nursing note reviewed.  Constitutional:      Appearance: Normal appearance. She is not toxic-appearing.  HENT:     Head: Atraumatic.  Eyes:      Conjunctiva/sclera: Conjunctivae normal.  Neck:     Musculoskeletal: Normal range of motion and neck supple.     Comments: Mild neck stiffness on exam. Good ROM. No neurologic deficits. Cardiovascular:     Rate and Rhythm: Tachycardia present.  Pulmonary:     Effort: Pulmonary effort is normal.     Breath sounds: Normal breath sounds.  Abdominal:     General: There is no distension.     Palpations: Abdomen is soft.     Tenderness: There is no abdominal tenderness.  Musculoskeletal:        General: Tenderness present. No deformity.     Right hip: She exhibits tenderness. She exhibits no crepitus and no deformity.     Lumbar back: She exhibits spasm. She exhibits no bony tenderness.       Back:       Legs:     Comments: No obvious bruising.  Skin:    General: Skin is warm and dry.  Neurological:     Mental Status: She is alert and oriented to person, place, and time.     Sensory: No sensory deficit.  Psychiatric:        Mood and Affect: Mood normal.      ED Treatments / Results  Labs (all labs ordered are listed, but only abnormal results are displayed) Labs Reviewed - No data to display  EKG None  Radiology No results found.  Procedures Procedures (including critical care time)  Medications Ordered in ED Medications  ketorolac (TORADOL) 30 MG/ML injection 30 mg (30 mg Intramuscular Given 12/13/18 2133)  diazepam (VALIUM) tablet 5 mg (5 mg Oral Given 12/13/18 2133)     Initial Impression / Assessment and Plan / ED Course  I have reviewed the triage vital signs and the nursing notes.  Pertinent labs & imaging results that were available during my care of the patient were reviewed by me and considered in my medical decision making (see chart for details).      Pain improved after medication. Patient resting quietly on bed on reassessment.  Patient with fall at home. No obvious MSK injury. Discomfort in multiple areas. History of chronic pain, and is followed  by pain management. Patient given toradol and valium in ED. Pt advised to follow up with their PCP/PMS. Conservative therapy recommended and discussed. Patient will be discharged home & is agreeable with above plan. Returns precautions discussed. Pt appears safe for discharge.  Final Clinical Impressions(s) / ED Diagnoses   Final diagnoses:  Fall in home, initial encounter  Musculoskeletal pain    ED Discharge Orders    None  Felicie Morn, NP 12/13/18 2223    Vanetta Mulders, MD 12/18/18 [phone]

## 2018-12-15 ENCOUNTER — Encounter (HOSPITAL_BASED_OUTPATIENT_CLINIC_OR_DEPARTMENT_OTHER): Payer: Self-pay | Admitting: *Deleted

## 2018-12-15 ENCOUNTER — Emergency Department (HOSPITAL_BASED_OUTPATIENT_CLINIC_OR_DEPARTMENT_OTHER)
Admission: EM | Admit: 2018-12-15 | Discharge: 2018-12-15 | Disposition: A | Discharge status: Home / Self Care | Payer: Medicaid Other | Attending: Emergency Medicine | Admitting: Emergency Medicine

## 2018-12-15 ENCOUNTER — Other Ambulatory Visit: Payer: Self-pay

## 2018-12-15 DIAGNOSIS — F17210 Nicotine dependence, cigarettes, uncomplicated: Secondary | ICD-10-CM | POA: Insufficient documentation

## 2018-12-15 DIAGNOSIS — Z79899 Other long term (current) drug therapy: Secondary | ICD-10-CM | POA: Diagnosis not present

## 2018-12-15 DIAGNOSIS — Z79891 Long term (current) use of opiate analgesic: Secondary | ICD-10-CM | POA: Insufficient documentation

## 2018-12-15 DIAGNOSIS — F1190 Opioid use, unspecified, uncomplicated: Secondary | ICD-10-CM | POA: Diagnosis present

## 2018-12-15 MED ORDER — MORPHINE SULFATE (PF) 4 MG/ML IV SOLN
8.0000 mg | Freq: Once | INTRAVENOUS | Status: AC
  Administered 2018-12-15: 8 mg via INTRAMUSCULAR
  Filled 2018-12-15: qty 2

## 2018-12-15 MED ORDER — PROMETHAZINE HCL 25 MG PO TABS: 25.0000 mg | ORAL_TABLET | Freq: Four times a day (QID) | ORAL | 0 refills | Status: DC | PRN

## 2018-12-15 MED ORDER — CYCLOBENZAPRINE HCL 10 MG PO TABS: 10.0000 mg | ORAL_TABLET | Freq: Two times a day (BID) | ORAL | 0 refills | Status: DC | PRN

## 2018-12-15 NOTE — Discharge Instructions (Addendum)
You are seen in the emergency department regarding possible withdrawal from opioid medications.  We are sending you home with prescription for Flexeril to help with muscle aches, take this twice per day as needed for pain.  Do not drive or operate heavy machinery while taking this medication as it can make you sleepy/drowsy.  We also 10 you home with Phenergan to help with nausea/vomiting, take this every 6 hours as needed for the symptoms.  We have prescribed you new medication(s) today. Discuss the medications prescribed today with your pharmacist as they can have adverse effects and interactions with your other medicines including over the counter and prescribed medications. Seek medical evaluation if you start to experience new or abnormal symptoms after taking one of these medicines, seek care immediately if you start to experience difficulty breathing, feeling of your throat closing, facial swelling, or rash as these could be indications of a more serious allergic reaction  Call your pain management doctor first thing Monday morning to discuss refills.  Return to the ER for new or worsening symptoms including but not limited to worsening pain, inability to keep fluids down, fever, or any other concerns.
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
## 2018-12-16 ENCOUNTER — Encounter (HOSPITAL_BASED_OUTPATIENT_CLINIC_OR_DEPARTMENT_OTHER): Payer: Self-pay | Admitting: *Deleted

## 2018-12-16 ENCOUNTER — Other Ambulatory Visit: Payer: Self-pay

## 2018-12-16 ENCOUNTER — Emergency Department (HOSPITAL_BASED_OUTPATIENT_CLINIC_OR_DEPARTMENT_OTHER)
Admission: EM | Admit: 2018-12-16 | Discharge: 2018-12-16 | Disposition: A | Discharge status: Home / Self Care | Payer: Medicaid Other | Attending: Emergency Medicine | Admitting: Emergency Medicine

## 2018-12-16 DIAGNOSIS — Z79899 Other long term (current) drug therapy: Secondary | ICD-10-CM | POA: Diagnosis not present

## 2018-12-16 DIAGNOSIS — F1129 Opioid dependence with unspecified opioid-induced disorder: Secondary | ICD-10-CM | POA: Insufficient documentation

## 2018-12-16 MED ORDER — HYDROCODONE-ACETAMINOPHEN 5-325 MG PO TABS
2.0000 | ORAL_TABLET | Freq: Once | ORAL | Status: AC
  Administered 2018-12-16: 20:00:00 2 via ORAL
  Filled 2018-12-16: qty 2
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
## 2018-12-16 NOTE — ED Notes (Signed)
ED Provider at bedside. 
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
## 2019-01-21 ENCOUNTER — Emergency Department (HOSPITAL_BASED_OUTPATIENT_CLINIC_OR_DEPARTMENT_OTHER)
Admission: EM | Admit: 2019-01-21 | Discharge: 2019-01-21 | Disposition: A | Discharge status: Home / Self Care | Payer: Medicaid Other | Attending: Emergency Medicine | Admitting: Emergency Medicine

## 2019-01-21 ENCOUNTER — Encounter (HOSPITAL_BASED_OUTPATIENT_CLINIC_OR_DEPARTMENT_OTHER): Payer: Self-pay

## 2019-01-21 ENCOUNTER — Emergency Department (HOSPITAL_BASED_OUTPATIENT_CLINIC_OR_DEPARTMENT_OTHER): Payer: Medicaid Other

## 2019-01-21 ENCOUNTER — Other Ambulatory Visit: Payer: Self-pay

## 2019-01-21 DIAGNOSIS — Y9289 Other specified places as the place of occurrence of the external cause: Secondary | ICD-10-CM | POA: Insufficient documentation

## 2019-01-21 DIAGNOSIS — Y999 Unspecified external cause status: Secondary | ICD-10-CM | POA: Diagnosis not present

## 2019-01-21 DIAGNOSIS — Y9389 Activity, other specified: Secondary | ICD-10-CM | POA: Diagnosis not present

## 2019-01-21 DIAGNOSIS — W01198A Fall on same level from slipping, tripping and stumbling with subsequent striking against other object, initial encounter: Secondary | ICD-10-CM | POA: Diagnosis not present

## 2019-01-21 DIAGNOSIS — F17210 Nicotine dependence, cigarettes, uncomplicated: Secondary | ICD-10-CM | POA: Diagnosis not present

## 2019-01-21 DIAGNOSIS — S99922A Unspecified injury of left foot, initial encounter: Secondary | ICD-10-CM | POA: Diagnosis present

## 2019-01-21 DIAGNOSIS — Z79899 Other long term (current) drug therapy: Secondary | ICD-10-CM | POA: Diagnosis not present

## 2019-01-21 DIAGNOSIS — S9032XA Contusion of left foot, initial encounter: Secondary | ICD-10-CM

## 2019-01-21 NOTE — ED Triage Notes (Signed)
Pt states tripped over bag of kitty litter in kitchen yesterday, increased pain, swelling, left 5th toe and arch of foot.  Some swelling, no visible bruising to area.

## 2019-01-21 NOTE — Discharge Instructions (Signed)
Wear a good soled shoe and elevate when you can.

## 2019-01-21 NOTE — ED Provider Notes (Signed)
[facility] Provider Note   CSN: [id number] Arrival date & time: 01/21/19  1018     History   Chief Complaint Chief Complaint  Patient presents with  . Foot Pain    HPI Beth Porter is a 43 y.o. female.     The history is provided by the patient.  Foot Pain This is a new problem. The current episode started yesterday. The problem occurs constantly. The problem has not changed since onset.Associated symptoms comments: Pt tripped over a bag of kitty litter hurting the lateral portion of the left foot.  Since that time she has had pain in the left side of her foot and her fifth toe.  Pain is worse with walking and she is having to limp.  It is a sharp shooting pain when she tries to walk.  She has no ankle pain or pain further up her leg.. The symptoms are aggravated by walking. The symptoms are relieved by rest. She has tried rest for the symptoms. The treatment provided no relief.    Past Medical History:  Diagnosis Date  . Chronic back pain   . GERD (gastroesophageal reflux disease)   . Migraine     There are no active problems to display for this patient.   Past Surgical History:  Procedure Laterality Date  . abdominal laproscopy x 2    . APPENDECTOMY    . CESAREAN SECTION     x 3  . TUBAL LIGATION       OB History   No obstetric history on file.      Home Medications    Prior to Admission medications   Medication Sig Start Date End Date Taking? Authorizing Provider  benzonatate (TESSALON) 100 MG capsule Take 2 capsules (200 mg total) by mouth every 8 (eight) hours. 04/14/18   Khatri, Hina, PA-C  cyclobenzaprine (FLEXERIL) 10 MG tablet Take 1 tablet (10 mg total) by mouth 2 (two) times daily as needed for muscle spasms. 12/15/18   Petrucelli, Samantha R, PA-C  fluticasone (FLONASE) 50 MCG/ACT nasal spray Place 1 spray into both nostrils daily. 04/14/18   Khatri, Hina, PA-C  ibuprofen (ADVIL,MOTRIN) 600 MG tablet Take 1 tablet (600  mg total) by mouth every 8 (eight) hours as needed. Take with food. 04/09/18   Lajean Saver, MD  methocarbamol (ROBAXIN) 500 MG tablet Take 1 tablet (500 mg total) by mouth 2 (two) times daily. 12/13/18   Etta Quill, NP  morphine (KADIAN) 50 MG 24 hr capsule Take by mouth.    [provider]  naloxone Karma Greaser) 2 MG/2ML injection For suspected opioid overdose, spray 1 mL in each nostril.  Repeat after 3 minutes if no or minimal response. 01/24/17   [provider]  neomycin-polymyxin-hydrocortisone (CORTISPORIN) 3.5-10000-1 OTIC suspension Place 4 drops into the right ear 3 (three) times daily. 04/14/18   Khatri, Hina, PA-C  ondansetron (ZOFRAN ODT) 8 MG disintegrating tablet Take 1 tablet (8 mg total) by mouth every 8 (eight) hours as needed for nausea or vomiting. 04/09/18   Lajean Saver, MD  pantoprazole (PROTONIX) 40 MG tablet Take 1 tablet (40 mg total) by mouth daily. 11/11/18   Law, Bea Graff, PA-C  promethazine (PHENERGAN) 25 MG tablet Take 1 tablet (25 mg total) by mouth every 6 (six) hours as needed for nausea or vomiting. 12/15/18   Petrucelli, Samantha R, PA-C  sucralfate (CARAFATE) 1 GM/10ML suspension Take 10 mLs (1 g total) by mouth 4 (four) times daily -  with meals and at bedtime. 11/11/18   Emi HolesLaw, Alexandra M, PA-C  SUMAtriptan (IMITREX) 100 MG tablet Take by mouth. 01/09/17   [provider]    Family History History reviewed. No pertinent family history.  Social History Social History   Tobacco Use  . Smoking status: Current Every Day Smoker    Types: Cigarettes  . Smokeless tobacco: Never Used  Substance Use Topics  . Alcohol use: Yes    Comment: Very Rare  . Drug use: Never     Allergies   Aspirin and Metronidazole   Review of Systems Review of Systems  All other systems reviewed and are negative.    Physical Exam Updated Vital Signs BP 129/86 (BP Location: Right Arm)   Pulse 88   Temp 98.4 F (36.9 C) (Oral)   Resp 18   SpO2  98%   Physical Exam Vitals signs and nursing note reviewed.  Constitutional:      General: She is not in acute distress.    Appearance: She is normal weight.  Eyes:     Pupils: Pupils are equal, round, and reactive to light.  Cardiovascular:     Rate and Rhythm: Normal rate.  Pulmonary:     Effort: Pulmonary effort is normal.  Musculoskeletal:        General: Tenderness present.     Left knee: Normal.     Left ankle: Normal.     Left foot: Normal capillary refill. Tenderness and bony tenderness present. No deformity.       Feet:  Skin:    General: Skin is warm and dry.     Capillary Refill: Capillary refill takes less than 2 seconds.  Neurological:     General: No focal deficit present.     Mental Status: She is alert. Mental status is at baseline.  Psychiatric:        Mood and Affect: Mood normal.        Behavior: Behavior normal.        Thought Content: Thought content normal.      ED Treatments / Results  Labs (all labs ordered are listed, but only abnormal results are displayed) Labs Reviewed - No data to display  EKG None  Radiology Dg Foot Complete Left  Result Date: 01/21/2019 CLINICAL DATA:  Injury to LEFT fifth toe. EXAM: LEFT FOOT - COMPLETE 3+ VIEW COMPARISON:  None. FINDINGS: Osseous alignment is normal. No fracture line or displaced fracture fragment seen. Adjacent soft tissues are unremarkable. IMPRESSION: Negative. Electronically Signed   By: Bary RichardStan  Maynard M.D.   On: 01/21/2019 11:07    Procedures Procedures (including critical care time)  Medications Ordered in ED Medications - No data to display   Initial Impression / Assessment and Plan / ED Course  I have reviewed the triage vital signs and the nursing notes.  Pertinent labs & imaging results that were available during my care of the patient were reviewed by me and considered in my medical decision making (see chart for details).        Patient with an injury to her lateral left foot  after tripping yesterday.  She has had ongoing pain and difficulty walking.  She has no ankle injury or concern for Jones fracture.  We will do an x-ray to look for toe fracture or distal metatarsal fracture.  11:29 AM X-ray neg for fracture  Final Clinical Impressions(s) / ED Diagnoses   Final diagnoses:  Contusion of left foot, initial encounter  ED Discharge Orders    None       Gwyneth SproutPlunkett, Nyron Mozer, MD 01/21/19 1131

## 2019-01-26 ENCOUNTER — Encounter (HOSPITAL_BASED_OUTPATIENT_CLINIC_OR_DEPARTMENT_OTHER): Payer: Self-pay | Admitting: *Deleted

## 2019-01-26 ENCOUNTER — Emergency Department (HOSPITAL_BASED_OUTPATIENT_CLINIC_OR_DEPARTMENT_OTHER)
Admission: EM | Admit: 2019-01-26 | Discharge: 2019-01-26 | Disposition: A | Discharge status: Home / Self Care | Payer: Medicaid Other | Attending: Emergency Medicine | Admitting: Emergency Medicine

## 2019-01-26 ENCOUNTER — Other Ambulatory Visit: Payer: Self-pay

## 2019-01-26 DIAGNOSIS — F17210 Nicotine dependence, cigarettes, uncomplicated: Secondary | ICD-10-CM | POA: Diagnosis not present

## 2019-01-26 DIAGNOSIS — Z79899 Other long term (current) drug therapy: Secondary | ICD-10-CM | POA: Diagnosis not present

## 2019-01-26 DIAGNOSIS — R519 Headache, unspecified: Secondary | ICD-10-CM

## 2019-01-26 DIAGNOSIS — R51 Headache: Secondary | ICD-10-CM | POA: Insufficient documentation

## 2019-01-26 MED ORDER — HYDROMORPHONE HCL 1 MG/ML IJ SOLN
1.0000 mg | Freq: Once | INTRAMUSCULAR | Status: AC
  Administered 2019-01-26: 1 mg via INTRAVENOUS
  Filled 2019-01-26: qty 1

## 2019-01-26 MED ORDER — KETOROLAC TROMETHAMINE 30 MG/ML IJ SOLN
30.0000 mg | Freq: Once | INTRAMUSCULAR | Status: AC
  Administered 2019-01-26: 30 mg via INTRAVENOUS
  Filled 2019-01-26: qty 1

## 2019-01-26 MED ORDER — DEXAMETHASONE SODIUM PHOSPHATE 10 MG/ML IJ SOLN
10.0000 mg | Freq: Once | INTRAMUSCULAR | Status: AC
  Administered 2019-01-26: 10 mg via INTRAVENOUS
  Filled 2019-01-26: qty 1

## 2019-01-26 MED ORDER — SODIUM CHLORIDE 0.9 % IV BOLUS
1000.0000 mL | Freq: Once | INTRAVENOUS | Status: AC
  Administered 2019-01-26: 1000 mL via INTRAVENOUS

## 2019-01-26 MED ORDER — METOCLOPRAMIDE HCL 5 MG/ML IJ SOLN
10.0000 mg | Freq: Once | INTRAMUSCULAR | Status: AC
  Administered 2019-01-26: 10 mg via INTRAVENOUS
  Filled 2019-01-26: qty 2

## 2019-01-26 MED ORDER — DIPHENHYDRAMINE HCL 50 MG/ML IJ SOLN
25.0000 mg | Freq: Once | INTRAMUSCULAR | Status: AC
  Administered 2019-01-26: 25 mg via INTRAVENOUS
  Filled 2019-01-26: qty 1

## 2019-01-26 NOTE — Discharge Instructions (Addendum)
Ibuprofen 600 mg every 6 hours as needed for pain. ° °Return to the emergency department if symptoms significantly worsen or change. °

## 2019-01-26 NOTE — ED Triage Notes (Signed)
Py reports mha since 8am with nausea and vomiting

## 2019-01-26 NOTE — ED Provider Notes (Signed)
[facility] EMERGENCY DEPARTMENT Provider Note   CSN: [id number] Arrival date & time: 01/26/19  1936     History   Chief Complaint Chief Complaint  Patient presents with  . Migraine    HPI Beth Porter is a 43 y.o. female.     Patient is a 43 year old female with past medical history of chronic back pain, GERD, migraine headaches.  She presents today for evaluation of headache.  This started earlier today and is rapidly worsening.  She denies any injury or trauma.  She denies any visual disturbances.  She denies any numbness or tingling.  Her pain as to both temples and radiates to the back of her head.  She has taken ibuprofen with little relief.  Patient on chronic opioids, however has not taken any of these today due to nausea from her headache.  The history is provided by the patient.  Migraine This is a new problem. Episode onset: This morning. The problem occurs constantly. The problem has been rapidly worsening. The symptoms are aggravated by bending and twisting (Noise and light). Treatments tried: Ibuprofen. The treatment provided no relief.    Past Medical History:  Diagnosis Date  . Chronic back pain   . GERD (gastroesophageal reflux disease)   . Migraine     There are no active problems to display for this patient.   Past Surgical History:  Procedure Laterality Date  . abdominal laproscopy x 2    . APPENDECTOMY    . CESAREAN SECTION     x 3  . TUBAL LIGATION       OB History   No obstetric history on file.      Home Medications    Prior to Admission medications   Medication Sig Start Date End Date Taking? Authorizing Provider  benzonatate (TESSALON) 100 MG capsule Take 2 capsules (200 mg total) by mouth every 8 (eight) hours. 04/14/18   Khatri, Hina, PA-C  cyclobenzaprine (FLEXERIL) 10 MG tablet Take 1 tablet (10 mg total) by mouth 2 (two) times daily as needed for muscle spasms. 12/15/18   Petrucelli, Samantha R, PA-C  fluticasone  (FLONASE) 50 MCG/ACT nasal spray Place 1 spray into both nostrils daily. 04/14/18   Khatri, Hina, PA-C  ibuprofen (ADVIL,MOTRIN) 600 MG tablet Take 1 tablet (600 mg total) by mouth every 8 (eight) hours as needed. Take with food. 04/09/18   Cathren LaineSteinl, Kevin, MD  methocarbamol (ROBAXIN) 500 MG tablet Take 1 tablet (500 mg total) by mouth 2 (two) times daily. 12/13/18   Felicie MornSmith, David, NP  morphine (KADIAN) 50 MG 24 hr capsule Take by mouth.    [provider]  naloxone Jonelle Sports(NARCAN) 2 MG/2ML injection For suspected opioid overdose, spray 1 mL in each nostril.  Repeat after 3 minutes if no or minimal response. 01/24/17   [provider]  neomycin-polymyxin-hydrocortisone (CORTISPORIN) 3.5-10000-1 OTIC suspension Place 4 drops into the right ear 3 (three) times daily. 04/14/18   Khatri, Hina, PA-C  ondansetron (ZOFRAN ODT) 8 MG disintegrating tablet Take 1 tablet (8 mg total) by mouth every 8 (eight) hours as needed for nausea or vomiting. 04/09/18   Cathren LaineSteinl, Kevin, MD  pantoprazole (PROTONIX) 40 MG tablet Take 1 tablet (40 mg total) by mouth daily. 11/11/18   Law, Waylan BogaAlexandra M, PA-C  promethazine (PHENERGAN) 25 MG tablet Take 1 tablet (25 mg total) by mouth every 6 (six) hours as needed for nausea or vomiting. 12/15/18   Petrucelli, Samantha R, PA-C  sucralfate (CARAFATE) 1 GM/10ML  suspension Take 10 mLs (1 g total) by mouth 4 (four) times daily -  with meals and at bedtime. 11/11/18   Frederica Kuster, PA-C  SUMAtriptan (IMITREX) 100 MG tablet Take by mouth. 01/09/17   [provider]    Family History No family history on file.  Social History Social History   Tobacco Use  . Smoking status: Current Every Day Smoker    Types: Cigarettes  . Smokeless tobacco: Never Used  Substance Use Topics  . Alcohol use: Yes    Comment: Very Rare  . Drug use: Never     Allergies   Aspirin and Metronidazole   Review of Systems Review of Systems  All other systems reviewed and are negative.     Physical Exam Updated Vital Signs BP (!) 161/95 (BP Location: Right Arm)   Pulse 97   Temp 98.2 F (36.8 C) (Oral)   Resp 18   Ht 4\' 10"  (1.473 m)   Wt 53.5 kg   LMP 01/03/2019 (Approximate)   SpO2 99%   BMI 24.66 kg/m   Physical Exam Vitals signs and nursing note reviewed.  Constitutional:      General: She is not in acute distress.    Appearance: She is well-developed. She is not diaphoretic.  HENT:     Head: Normocephalic and atraumatic.  Eyes:     Extraocular Movements: Extraocular movements intact.     Pupils: Pupils are equal, round, and reactive to light.  Neck:     Musculoskeletal: Normal range of motion and neck supple.  Cardiovascular:     Rate and Rhythm: Normal rate and regular rhythm.     Heart sounds: No murmur. No friction rub. No gallop.   Pulmonary:     Effort: Pulmonary effort is normal. No respiratory distress.     Breath sounds: Normal breath sounds. No wheezing.  Abdominal:     General: Bowel sounds are normal. There is no distension.     Palpations: Abdomen is soft.     Tenderness: There is no abdominal tenderness.  Musculoskeletal: Normal range of motion.  Skin:    General: Skin is warm and dry.  Neurological:     General: No focal deficit present.     Mental Status: She is alert and oriented to person, place, and time.     Cranial Nerves: No cranial nerve deficit.     Motor: No weakness.     Coordination: Coordination normal.      ED Treatments / Results  Labs (all labs ordered are listed, but only abnormal results are displayed) Labs Reviewed - No data to display  EKG None  Radiology No results found.  Procedures Procedures (including critical care time)  Medications Ordered in ED Medications  sodium chloride 0.9 % bolus 1,000 mL (has no administration in time range)  ketorolac (TORADOL) 30 MG/ML injection 30 mg (has no administration in time range)  dexamethasone (DECADRON) injection 10 mg (has no administration in time  range)  metoCLOPramide (REGLAN) injection 10 mg (has no administration in time range)  diphenhydrAMINE (BENADRYL) injection 25 mg (has no administration in time range)     Initial Impression / Assessment and Plan / ED Course  I have reviewed the triage vital signs and the nursing notes.  Pertinent labs & imaging results that were available during my care of the patient were reviewed by me and considered in my medical decision making (see chart for details).  Patient presenting with complaints of headache.  She  has a history of migraines and this feels somewhat similar.  Her neurologic exam is nonfocal and she is now feeling better after a migraine cocktail, fluids, and Dilaudid.  At this point, patient appears appropriate for discharge.  She is to follow-up as needed.  Final Clinical Impressions(s) / ED Diagnoses   Final diagnoses:  None    ED Discharge Orders    None       Geoffery Lyonselo, Argusta Mcgann, MD 01/26/19 2233

## 2019-01-27 ENCOUNTER — Emergency Department (HOSPITAL_BASED_OUTPATIENT_CLINIC_OR_DEPARTMENT_OTHER)
Admission: EM | Admit: 2019-01-27 | Discharge: 2019-01-27 | Discharge status: Against Medical Advice | Payer: Medicaid Other | Attending: Emergency Medicine | Admitting: Emergency Medicine

## 2019-01-27 ENCOUNTER — Other Ambulatory Visit: Payer: Self-pay

## 2019-01-27 DIAGNOSIS — Z5321 Procedure and treatment not carried out due to patient leaving prior to being seen by health care provider: Secondary | ICD-10-CM | POA: Diagnosis not present

## 2019-01-27 DIAGNOSIS — M25551 Pain in right hip: Secondary | ICD-10-CM | POA: Diagnosis present

## 2019-01-27 DIAGNOSIS — M25511 Pain in right shoulder: Secondary | ICD-10-CM | POA: Insufficient documentation

## 2019-01-27 NOTE — ED Triage Notes (Signed)
Pt states slipping and  falling when getting out of shower, landing on right hip. Also c/o right shoulder pain. Denies loc, dizziness. Pt verbalized taking 1 hydrocodone approx 0900 today

## 2019-02-19 ENCOUNTER — Encounter (HOSPITAL_BASED_OUTPATIENT_CLINIC_OR_DEPARTMENT_OTHER): Payer: Self-pay | Admitting: Emergency Medicine

## 2019-02-19 ENCOUNTER — Emergency Department (HOSPITAL_BASED_OUTPATIENT_CLINIC_OR_DEPARTMENT_OTHER)
Admission: EM | Admit: 2019-02-19 | Discharge: 2019-02-19 | Disposition: A | Discharge status: Home / Self Care | Payer: Medicaid Other | Attending: Emergency Medicine | Admitting: Emergency Medicine

## 2019-02-19 ENCOUNTER — Other Ambulatory Visit: Payer: Self-pay

## 2019-02-19 DIAGNOSIS — K2900 Acute gastritis without bleeding: Secondary | ICD-10-CM | POA: Diagnosis not present

## 2019-02-19 DIAGNOSIS — R112 Nausea with vomiting, unspecified: Secondary | ICD-10-CM | POA: Diagnosis present

## 2019-02-19 DIAGNOSIS — F17210 Nicotine dependence, cigarettes, uncomplicated: Secondary | ICD-10-CM | POA: Insufficient documentation

## 2019-02-19 DIAGNOSIS — Z79899 Other long term (current) drug therapy: Secondary | ICD-10-CM | POA: Diagnosis not present

## 2019-02-19 LAB — CBC WITH DIFFERENTIAL/PLATELET
Abs Immature Granulocytes: 0.03 10*3/uL (ref 0.00–0.07)
Basophils Absolute: 0.1 10*3/uL (ref 0.0–0.1)
Basophils Relative: 0 %
Eosinophils Absolute: 0 10*3/uL (ref 0.0–0.5)
Eosinophils Relative: 0 %
HCT: 50.6 % — ABNORMAL HIGH (ref 36.0–46.0)
Hemoglobin: 16.4 g/dL — ABNORMAL HIGH (ref 12.0–15.0)
Immature Granulocytes: 0 %
Lymphocytes Relative: 18 %
Lymphs Abs: 2.3 10*3/uL (ref 0.7–4.0)
MCH: 27.9 pg (ref 26.0–34.0)
MCHC: 32.4 g/dL (ref 30.0–36.0)
MCV: 86.1 fL (ref 80.0–100.0)
Monocytes Absolute: 0.3 10*3/uL (ref 0.1–1.0)
Monocytes Relative: 2 %
Neutro Abs: 9.8 10*3/uL — ABNORMAL HIGH (ref 1.7–7.7)
Neutrophils Relative %: 80 %
Platelets: 394 10*3/uL (ref 150–400)
RBC: 5.88 MIL/uL — ABNORMAL HIGH (ref 3.87–5.11)
RDW: 13.1 % (ref 11.5–15.5)
WBC: 12.5 10*3/uL — ABNORMAL HIGH (ref 4.0–10.5)
nRBC: 0 % (ref 0.0–0.2)

## 2019-02-19 LAB — URINALYSIS, ROUTINE W REFLEX MICROSCOPIC
Bilirubin Urine: NEGATIVE
Glucose, UA: NEGATIVE mg/dL
Ketones, ur: 15 mg/dL — AB
Leukocytes,Ua: NEGATIVE
Nitrite: NEGATIVE
Protein, ur: NEGATIVE mg/dL
Specific Gravity, Urine: 1.02 (ref 1.005–1.030)
pH: 6.5 (ref 5.0–8.0)

## 2019-02-19 LAB — COMPREHENSIVE METABOLIC PANEL
ALT: 47 U/L — ABNORMAL HIGH (ref 0–44)
AST: 34 U/L (ref 15–41)
Albumin: 4.6 g/dL (ref 3.5–5.0)
Alkaline Phosphatase: 79 U/L (ref 38–126)
Anion gap: 12 (ref 5–15)
BUN: 10 mg/dL (ref 6–20)
CO2: 22 mmol/L (ref 22–32)
Calcium: 9.4 mg/dL (ref 8.9–10.3)
Chloride: 104 mmol/L (ref 98–111)
Creatinine, Ser: 0.79 mg/dL (ref 0.44–1.00)
GFR calc Af Amer: >60 mL/min (ref >60)
GFR calc non Af Amer: >60 mL/min (ref >60)
Glucose, Bld: 138 mg/dL — ABNORMAL HIGH (ref 70–99)
Potassium: 4 mmol/L (ref 3.5–5.1)
Sodium: 138 mmol/L (ref 135–145)
Total Bilirubin: 0.4 mg/dL (ref 0.3–1.2)
Total Protein: 8.1 g/dL (ref 6.5–8.1)

## 2019-02-19 LAB — URINALYSIS, MICROSCOPIC (REFLEX)

## 2019-02-19 LAB — PREGNANCY, URINE: Preg Test, Ur: NEGATIVE

## 2019-02-19 LAB — LIPASE, BLOOD: Lipase: 26 U/L (ref 11–51)

## 2019-02-19 MED ORDER — MORPHINE SULFATE (PF) 4 MG/ML IV SOLN
4.0000 mg | Freq: Once | INTRAVENOUS | Status: AC
  Administered 2019-02-19: 15:00:00 4 mg via INTRAVENOUS
  Filled 2019-02-19: qty 1

## 2019-02-19 MED ORDER — SUCRALFATE 1 G PO TABS
1.0000 g | ORAL_TABLET | Freq: Once | ORAL | Status: DC
  Filled 2019-02-19: qty 1

## 2019-02-19 MED ORDER — MORPHINE SULFATE (PF) 2 MG/ML IV SOLN
2.0000 mg | Freq: Once | INTRAVENOUS | Status: AC
  Administered 2019-02-19: 17:00:00 2 mg via INTRAVENOUS
  Filled 2019-02-19: qty 1

## 2019-02-19 MED ORDER — ONDANSETRON HCL 4 MG PO TABS: 4.0000 mg | ORAL_TABLET | Freq: Three times a day (TID) | ORAL | 0 refills | Status: DC | PRN

## 2019-02-19 MED ORDER — SODIUM CHLORIDE 0.9 % IV BOLUS
500.0000 mL | Freq: Once | INTRAVENOUS | Status: AC
  Administered 2019-02-19: 500 mL via INTRAVENOUS

## 2019-02-19 MED ORDER — SUCRALFATE 1 GM/10ML PO SUSP
1.0000 g | Freq: Three times a day (TID) | ORAL | Status: DC
  Administered 2019-02-19: 16:00:00 1 g via ORAL
  Filled 2019-02-19: qty 10

## 2019-02-19 MED ORDER — SUCRALFATE 1 G PO TABS: 1.0000 g | ORAL_TABLET | Freq: Three times a day (TID) | ORAL | 0 refills | Status: DC

## 2019-02-19 MED ORDER — FAMOTIDINE IN NACL 20-0.9 MG/50ML-% IV SOLN
20.0000 mg | Freq: Once | INTRAVENOUS | Status: AC
  Administered 2019-02-19: 16:00:00 20 mg via INTRAVENOUS
  Filled 2019-02-19: qty 50

## 2019-02-19 MED ORDER — ALUM & MAG HYDROXIDE-SIMETH 200-200-20 MG/5ML PO SUSP
30.0000 mL | Freq: Once | ORAL | Status: AC
  Administered 2019-02-19: 15:00:00 30 mL via ORAL
  Filled 2019-02-19: qty 30

## 2019-02-19 MED ORDER — ONDANSETRON HCL 4 MG/2ML IJ SOLN
4.0000 mg | Freq: Once | INTRAMUSCULAR | Status: AC
  Administered 2019-02-19: 15:00:00 4 mg via INTRAVENOUS
  Filled 2019-02-19: qty 2

## 2019-02-19 NOTE — ED Triage Notes (Signed)
"  Feeling bad" since last week. Vomiting since yesterday.  Having trouble tolerating her chronic pain meds.

## 2019-02-19 NOTE — ED Notes (Signed)
ED Provider at bedside. 

## 2019-02-19 NOTE — ED Provider Notes (Signed)
[facility] EMERGENCY DEPARTMENT Provider Note   CSN: [id number] Arrival date & time: 02/19/19  1149    History   Chief Complaint Chief Complaint  Patient presents with  . Emesis    HPI Beth Porter is a 43 y.o. female.     HPI   Pt is a 43 y/o female with a h/o GERd, migraines, chronic back pain (in pain mgmt), who presents to the ED today for eval of abd pain, nausea, and vomiting (x5). Abd pain located to the epigastrium and to her "abd muscles". Pain rated 4/10. When pain first started it was constant but it has improved since onset.  Denies hematemesis, diarrhea.  Reports some constipation, but had last BM this AM. I still passing flatus. States she has had similar abd pain in the past and was dx with gastritis. Denies urinary or vaginal complaints. She is not currently sexually active and is not concerned for STDs,   Pt states she has not been able to keep down her pain meds or ppi.   Past Medical History:  Diagnosis Date  . Chronic back pain   . GERD (gastroesophageal reflux disease)   . Migraine     There are no active problems to display for this patient.   Past Surgical History:  Procedure Laterality Date  . abdominal laproscopy x 2    . APPENDECTOMY    . CESAREAN SECTION     x 3  . TUBAL LIGATION       OB History   No obstetric history on file.      Home Medications    Prior to Admission medications   Medication Sig Start Date End Date Taking? Authorizing Provider  buPROPion (WELLBUTRIN) 75 MG tablet Take 75 mg by mouth 2 (two) times daily.   Yes [provider]  HYDROcodone-acetaminophen (NORCO) 10-325 MG tablet Take 1 tablet by mouth every 2 (two) hours as needed for moderate pain.   Yes [provider]  morphine (KADIAN) 50 MG 24 hr capsule Take by mouth.   Yes [provider]  pantoprazole (PROTONIX) 40 MG tablet Take 1 tablet (40 mg total) by mouth daily. 11/11/18  Yes Law, Waylan BogaAlexandra M, PA-C  ibuprofen  (ADVIL,MOTRIN) 600 MG tablet Take 1 tablet (600 mg total) by mouth every 8 (eight) hours as needed. Take with food. 04/09/18   Cathren LaineSteinl, Kevin, MD  ondansetron (ZOFRAN) 4 MG tablet Take 1 tablet (4 mg total) by mouth every 8 (eight) hours as needed for nausea or vomiting. 02/19/19   Deshawna Mcneece S, PA-C  sucralfate (CARAFATE) 1 g tablet Take 1 tablet (1 g total) by mouth 4 (four) times daily -  with meals and at bedtime for 7 days. 02/19/19 02/26/19  Toa Mia S, PA-C    Family History No family history on file.  Social History Social History   Tobacco Use  . Smoking status: Current Every Day Smoker    Packs/day: 1.00    Types: Cigarettes  . Smokeless tobacco: Never Used  Substance Use Topics  . Alcohol use: Yes    Comment: Very Rare  . Drug use: Never     Allergies   Aspirin and Metronidazole   Review of Systems Review of Systems  Constitutional: Negative for chills and fever.  HENT: Negative for ear pain and sore throat.   Eyes: Negative for visual disturbance.  Respiratory: Negative for cough and shortness of breath.   Cardiovascular: Negative for chest pain.  Gastrointestinal: Positive for  abdominal pain, nausea and vomiting. Negative for constipation and diarrhea.  Genitourinary: Negative for dysuria and hematuria.  Musculoskeletal: Negative for back pain.  Skin: Negative for color change and rash.  Neurological: Negative for headaches.  All other systems reviewed and are negative.    Physical Exam Updated Vital Signs BP (!) 137/104 (BP Location: Right Arm)   Pulse 86   Temp 98.1 F (36.7 C) (Oral)   Resp 16   Ht 4\' 10"  (1.473 m)   Wt 64.6 kg   LMP 01/03/2019   SpO2 100%   BMI 29.78 kg/m   Physical Exam Vitals signs and nursing note reviewed.  Constitutional:      General: She is not in acute distress.    Appearance: She is well-developed.  HENT:     Head: Normocephalic and atraumatic.  Eyes:     Conjunctiva/sclera: Conjunctivae normal.  Neck:      Musculoskeletal: Neck supple.  Cardiovascular:     Rate and Rhythm: Normal rate and regular rhythm.     Pulses: Normal pulses.     Heart sounds: Normal heart sounds. No murmur.  Pulmonary:     Effort: Pulmonary effort is normal. No respiratory distress.     Breath sounds: Normal breath sounds. No wheezing, rhonchi or rales.  Abdominal:     General: Bowel sounds are normal.     Palpations: Abdomen is soft.     Tenderness: There is abdominal tenderness in the epigastric area. There is no guarding or rebound.  Skin:    General: Skin is warm and dry.  Neurological:     Mental Status: She is alert.      ED Treatments / Results  Labs (all labs ordered are listed, but only abnormal results are displayed) Labs Reviewed  CBC WITH DIFFERENTIAL/PLATELET - Abnormal; Notable for the following components:      Result Value   WBC 12.5 (*)    RBC 5.88 (*)    Hemoglobin 16.4 (*)    HCT 50.6 (*)    Neutro Abs 9.8 (*)    All other components within normal limits  COMPREHENSIVE METABOLIC PANEL - Abnormal; Notable for the following components:   Glucose, Bld 138 (*)    ALT 47 (*)    All other components within normal limits  URINALYSIS, ROUTINE W REFLEX MICROSCOPIC - Abnormal; Notable for the following components:   Hgb urine dipstick MODERATE (*)    Ketones, ur 15 (*)    All other components within normal limits  URINALYSIS, MICROSCOPIC (REFLEX) - Abnormal; Notable for the following components:   Bacteria, UA RARE (*)    All other components within normal limits  LIPASE, BLOOD  PREGNANCY, URINE    EKG None  Radiology No results found.  Procedures Procedures (including critical care time)  Medications Ordered in ED Medications  famotidine (PEPCID) IVPB 20 mg premix (0 mg Intravenous Stopped 02/19/19 1609)  ondansetron (ZOFRAN) injection 4 mg (4 mg Intravenous Given 02/19/19 1529)  alum & mag hydroxide-simeth (MAALOX/MYLANTA) 200-200-20 MG/5ML suspension 30 mL (30 mLs Oral Given  02/19/19 1529)  morphine 4 MG/ML injection 4 mg (4 mg Intravenous Given 02/19/19 1529)  sodium chloride 0.9 % bolus 500 mL (0 mLs Intravenous Stopped 02/19/19 1609)  morphine 2 MG/ML injection 2 mg (2 mg Intravenous Given 02/19/19 1707)     Initial Impression / Assessment and Plan / ED Course  I have reviewed the triage vital signs and the nursing notes.  Pertinent labs & imaging results that were  available during my care of the patient were reviewed by me and considered in my medical decision making (see chart for details).   Final Clinical Impressions(s) / ED Diagnoses   Final diagnoses:  Acute gastritis without hemorrhage, unspecified gastritis type   43 year old female presenting for epigastric abdominal pain nausea and vomiting starting yesterday.  No diarrhea.  No other associated GI/GU symptoms.  Does have history of chronic pain and is followed with pain management.  She is concerned because she cannot keep her pain medications down and her chronic back pain is worse.  Patient afebrile, blood pressure slightly elevated, but otherwise vital signs are within normal limits.  On exam, she is nontoxic, nonseptic appearing peach she does have some mild epigastric tenderness.  No rebound tenderness, no guarding.  Normal bowel sounds.  We will obtain labs, give IV fluids and medications and reassess. CBC with mild leukocytosis, has had elevations in the past.  She does look a little bit hemo-concentrated. CMP with normal electrolytes.  Kidney function is within normal limits.  ALT slightly elevated, AST is normal and T bilirubin is normal. Lipase is negative U preg neg.  UA with hematuria. No leukocytes or nitrites to suggest UTI.   Pt given pepcid, gi cocktail, Carafate and IV fluids.  On reassessment she states that her abdominal pain feels much improved.  She has had no episodes of vomiting in the ED and has been able to tolerate several glasses of fluids.  She states her back pain is still  hurting her since she has not had her medications.  We will give her additional dose of pain medications.  I feel that her symptoms are likely related to gastritis.  We will give her medications for home.  We will have her follow-up for any new or worsening symptoms.  She voiced understand the plan and reasons to return.  All questions answered.  Patient stable discharge but  ED Discharge Orders         Ordered    sucralfate (CARAFATE) 1 g tablet  3 times daily with meals & bedtime     02/19/19 1702    ondansetron (ZOFRAN) 4 MG tablet  Every 8 hours PRN     02/19/19 1702           Arra Connaughton S, PA-C 02/19/19 2148    Melene PlanFloyd, Dan, DO 02/20/19 1958

## 2019-02-19 NOTE — ED Notes (Signed)
2 unsuccessful IV attempts.

## 2019-02-19 NOTE — ED Notes (Signed)
Patient able to tolerate fluids with no issues.

## 2019-02-19 NOTE — Discharge Instructions (Signed)
Please take medications as directed.   Please follow up with your primary doctor within the next 5-7 days.  If you do not have a primary care provider, information for a healthcare clinic has been provided for you to make arrangements for follow up care. Please return to the ER sooner if you have any new or worsening symptoms, or if you have any of the following symptoms:  Abdominal pain that does not go away.  You have a fever.  You keep throwing up (vomiting).  The pain is felt only in portions of the abdomen. Pain in the right side could possibly be appendicitis. In an adult, pain in the left lower portion of the abdomen could be colitis or diverticulitis.  You pass bloody or black tarry stools.  There is bright red blood in the stool.  The constipation stays for more than 4 days.  There is belly (abdominal) or rectal pain.  You do not seem to be getting better.  You have any questions or concerns.

## 2019-02-19 NOTE — ED Notes (Signed)
Patient verbalizes understanding of discharge instructions. Opportunity for questioning and answers were provided. Armband removed by staff, pt discharged from ED.  

## 2019-12-02 ENCOUNTER — Ambulatory Visit (HOSPITAL_BASED_OUTPATIENT_CLINIC_OR_DEPARTMENT_OTHER)
Admission: RE | Admit: 2019-12-02 | Discharge: 2019-12-02 | Disposition: A | Discharge status: Home / Self Care | Payer: Medicaid Other | Source: Ambulatory Visit | Attending: Emergency Medicine | Admitting: Emergency Medicine

## 2019-12-02 ENCOUNTER — Emergency Department (HOSPITAL_BASED_OUTPATIENT_CLINIC_OR_DEPARTMENT_OTHER)
Admission: EM | Admit: 2019-12-02 | Discharge: 2019-12-02 | Disposition: A | Discharge status: Home / Self Care | Payer: Medicaid Other | Attending: Emergency Medicine | Admitting: Emergency Medicine

## 2019-12-02 ENCOUNTER — Other Ambulatory Visit: Payer: Self-pay

## 2019-12-02 ENCOUNTER — Encounter (HOSPITAL_BASED_OUTPATIENT_CLINIC_OR_DEPARTMENT_OTHER): Payer: Self-pay | Admitting: *Deleted

## 2019-12-02 DIAGNOSIS — R1011 Right upper quadrant pain: Secondary | ICD-10-CM | POA: Diagnosis not present

## 2019-12-02 DIAGNOSIS — Z888 Allergy status to other drugs, medicaments and biological substances status: Secondary | ICD-10-CM | POA: Diagnosis not present

## 2019-12-02 DIAGNOSIS — Z886 Allergy status to analgesic agent status: Secondary | ICD-10-CM | POA: Diagnosis not present

## 2019-12-02 DIAGNOSIS — Z79899 Other long term (current) drug therapy: Secondary | ICD-10-CM | POA: Insufficient documentation

## 2019-12-02 DIAGNOSIS — F17210 Nicotine dependence, cigarettes, uncomplicated: Secondary | ICD-10-CM | POA: Diagnosis not present

## 2019-12-02 DIAGNOSIS — R1013 Epigastric pain: Secondary | ICD-10-CM | POA: Insufficient documentation

## 2019-12-02 HISTORY — DX: Fibromyalgia: M79.7

## 2019-12-02 HISTORY — DX: Unspecified osteoarthritis, unspecified site: M19.90

## 2019-12-02 LAB — URINALYSIS, ROUTINE W REFLEX MICROSCOPIC
Bilirubin Urine: NEGATIVE
Glucose, UA: NEGATIVE mg/dL
Ketones, ur: NEGATIVE mg/dL
Nitrite: NEGATIVE
Protein, ur: NEGATIVE mg/dL
Specific Gravity, Urine: <1.005 — ABNORMAL LOW (ref 1.005–1.030)
pH: 6 (ref 5.0–8.0)

## 2019-12-02 LAB — URINALYSIS, MICROSCOPIC (REFLEX)

## 2019-12-02 LAB — COMPREHENSIVE METABOLIC PANEL
ALT: 17 U/L (ref 0–44)
AST: 18 U/L (ref 15–41)
Albumin: 3.9 g/dL (ref 3.5–5.0)
Alkaline Phosphatase: 86 U/L (ref 38–126)
Anion gap: 12 (ref 5–15)
BUN: 12 mg/dL (ref 6–20)
CO2: 22 mmol/L (ref 22–32)
Calcium: 9.7 mg/dL (ref 8.9–10.3)
Chloride: 107 mmol/L (ref 98–111)
Creatinine, Ser: 0.67 mg/dL (ref 0.44–1.00)
GFR calc Af Amer: >60 mL/min (ref >60)
GFR calc non Af Amer: >60 mL/min (ref >60)
Glucose, Bld: 115 mg/dL — ABNORMAL HIGH (ref 70–99)
Potassium: 3.9 mmol/L (ref 3.5–5.1)
Sodium: 141 mmol/L (ref 135–145)
Total Bilirubin: 0.3 mg/dL (ref 0.3–1.2)
Total Protein: 7.1 g/dL (ref 6.5–8.1)

## 2019-12-02 LAB — CBC WITH DIFFERENTIAL/PLATELET
Abs Immature Granulocytes: 0.1 10*3/uL — ABNORMAL HIGH (ref 0.00–0.07)
Basophils Absolute: 0.1 10*3/uL (ref 0.0–0.1)
Basophils Relative: 1 %
Eosinophils Absolute: 0.3 10*3/uL (ref 0.0–0.5)
Eosinophils Relative: 2 %
HCT: 43.3 % (ref 36.0–46.0)
Hemoglobin: 14.2 g/dL (ref 12.0–15.0)
Immature Granulocytes: 1 %
Lymphocytes Relative: 35 %
Lymphs Abs: 5 10*3/uL — ABNORMAL HIGH (ref 0.7–4.0)
MCH: 28.1 pg (ref 26.0–34.0)
MCHC: 32.8 g/dL (ref 30.0–36.0)
MCV: 85.7 fL (ref 80.0–100.0)
Monocytes Absolute: 0.6 10*3/uL (ref 0.1–1.0)
Monocytes Relative: 4 %
Neutro Abs: 8.4 10*3/uL — ABNORMAL HIGH (ref 1.7–7.7)
Neutrophils Relative %: 57 %
Platelets: 408 10*3/uL — ABNORMAL HIGH (ref 150–400)
RBC: 5.05 MIL/uL (ref 3.87–5.11)
RDW: 14.1 % (ref 11.5–15.5)
WBC: 14.4 10*3/uL — ABNORMAL HIGH (ref 4.0–10.5)
nRBC: 0 % (ref 0.0–0.2)

## 2019-12-02 LAB — PREGNANCY, URINE: Preg Test, Ur: NEGATIVE

## 2019-12-02 LAB — LIPASE, BLOOD: Lipase: 32 U/L (ref 11–51)

## 2019-12-02 MED ORDER — MORPHINE SULFATE (PF) 4 MG/ML IV SOLN
4.0000 mg | Freq: Once | INTRAVENOUS | Status: AC
  Administered 2019-12-02: 4 mg via INTRAVENOUS
  Filled 2019-12-02: qty 1

## 2019-12-02 MED ORDER — LIDOCAINE VISCOUS HCL 2 % MT SOLN
15.0000 mL | Freq: Once | OROMUCOSAL | Status: AC
  Administered 2019-12-02: 15 mL via ORAL
  Filled 2019-12-02: qty 15

## 2019-12-02 MED ORDER — ONDANSETRON 4 MG PO TBDP: 4.0000 mg | ORAL_TABLET | Freq: Three times a day (TID) | ORAL | 0 refills | Status: DC | PRN

## 2019-12-02 MED ORDER — OXYCODONE-ACETAMINOPHEN 5-325 MG PO TABS
1.0000 | ORAL_TABLET | Freq: Once | ORAL | Status: AC
  Administered 2019-12-02: 1 via ORAL
  Filled 2019-12-02: qty 1

## 2019-12-02 MED ORDER — OMEPRAZOLE 20 MG PO CPDR: 20.0000 mg | DELAYED_RELEASE_CAPSULE | Freq: Every day | ORAL | 0 refills | Status: DC

## 2019-12-02 MED ORDER — ONDANSETRON HCL 4 MG/2ML IJ SOLN
4.0000 mg | Freq: Once | INTRAMUSCULAR | Status: AC
  Administered 2019-12-02: 4 mg via INTRAVENOUS
  Filled 2019-12-02: qty 2

## 2019-12-02 MED ORDER — ALUM & MAG HYDROXIDE-SIMETH 200-200-20 MG/5ML PO SUSP
30.0000 mL | Freq: Once | ORAL | Status: AC
  Administered 2019-12-02: 30 mL via ORAL
  Filled 2019-12-02: qty 30

## 2019-12-02 MED ORDER — SODIUM CHLORIDE 0.9 % IV BOLUS
1000.0000 mL | Freq: Once | INTRAVENOUS | Status: AC
  Administered 2019-12-02: 1000 mL via INTRAVENOUS

## 2019-12-02 NOTE — ED Triage Notes (Addendum)
C/o right upper quad abd pain that started 3 days ago but worse Sunday. States that she took ibuprofen around 5pm with little relief. States pain has been worse since around midnight. C/o nausea. Decreased appetite but states when she does each the pain increases. Denies urinary symptoms. Pt has a ride home

## 2019-12-02 NOTE — ED Provider Notes (Signed)
[facility] Provider Note   CSN: [id number] Arrival date & time: 12/02/19  0214     History Chief Complaint  Patient presents with  . Abdominal Pain    [facility] is a 44 y.o. female.  HPI     This is a 44 year old female with a history of fibromyalgia and reflux who presents with abdominal pain.  Patient reports 3-day history of worsening abdominal pain.  Pain is worse after eating.  It is epigastric and nonradiating.  She has taken ibuprofen and Mylanta with minimal relief.  She does not take daily NSAIDs.  She denies any alcohol use.  She reports nausea without vomiting.  She reports normal bowel movements.  Currently she rates her pain at 8 out of 10.  She states she has previously had similar pain like this in the past.  Past Medical History:  Diagnosis Date  . Chronic back pain   . DJD (degenerative joint disease)   . Fibromyalgia   . GERD (gastroesophageal reflux disease)   . Migraine     There are no problems to display for this patient.   Past Surgical History:  Procedure Laterality Date  . abdominal laproscopy x 2    . APPENDECTOMY    . CESAREAN SECTION     x 3  . TUBAL LIGATION       OB History   No obstetric history on file.     No family history on file.  Social History   Tobacco Use  . Smoking status: Current Every Day Smoker    Packs/day: 1.00    Types: Cigarettes  . Smokeless tobacco: Never Used  Substance Use Topics  . Alcohol use: Yes    Comment: Very Rare  . Drug use: Never    Home Medications Prior to Admission medications   Medication Sig Start Date End Date Taking? Authorizing Provider  pregabalin (LYRICA) 50 MG capsule Take 50 mg by mouth 2 (two) times daily.   Yes [provider]  buPROPion (WELLBUTRIN) 75 MG tablet Take 75 mg by mouth 2 (two) times daily.    [provider]  HYDROcodone-acetaminophen (NORCO) 10-325 MG tablet Take 1 tablet by mouth every 2 (two) hours as  needed for moderate pain.    [provider]  ibuprofen (ADVIL,MOTRIN) 600 MG tablet Take 1 tablet (600 mg total) by mouth every 8 (eight) hours as needed. Take with food. 04/09/18   Lajean Saver, MD  morphine (KADIAN) 50 MG 24 hr capsule Take by mouth.    [provider]  omeprazole (PRILOSEC) 20 MG capsule Take 1 capsule (20 mg total) by mouth daily. 12/02/19   Keysha Damewood, Barbette Hair, MD  ondansetron (ZOFRAN ODT) 4 MG disintegrating tablet Take 1 tablet (4 mg total) by mouth every 8 (eight) hours as needed. 12/02/19   Liona Wengert, Barbette Hair, MD  ondansetron (ZOFRAN) 4 MG tablet Take 1 tablet (4 mg total) by mouth every 8 (eight) hours as needed for nausea or vomiting. 02/19/19   Couture, Cortni S, PA-C  pantoprazole (PROTONIX) 40 MG tablet Take 1 tablet (40 mg total) by mouth daily. 11/11/18   Law, Bea Graff, PA-C  sucralfate (CARAFATE) 1 g tablet Take 1 tablet (1 g total) by mouth 4 (four) times daily -  with meals and at bedtime for 7 days. 02/19/19 02/26/19  Couture, Cortni S, PA-C    Allergies    Aspirin and Metronidazole  Review of Systems   Review of Systems  Constitutional: Negative for fever.  Respiratory: Negative for shortness of breath.   Cardiovascular: Negative for chest pain.  Gastrointestinal: Positive for abdominal pain, nausea and vomiting. Negative for constipation and diarrhea.  Genitourinary: Negative for dysuria.  All other systems reviewed and are negative.   Physical Exam Updated Vital Signs BP (!) 168/89 (BP Location: Right Arm)   Pulse (!) 105   Temp 98.5 F (36.9 C) (Oral)   Resp 20   Ht 1.499 m (4\' 11" )   Wt 70.3 kg   LMP 11/04/2019   SpO2 100%   BMI 31.31 kg/m   Physical Exam Vitals and nursing note reviewed.  Constitutional:      Appearance: She is well-developed. She is not ill-appearing.     Comments: Overweight  HENT:     Head: Normocephalic and atraumatic.     Mouth/Throat:     Mouth: Mucous membranes are moist.  Eyes:     Pupils:  Pupils are equal, round, and reactive to light.  Cardiovascular:     Rate and Rhythm: Normal rate and regular rhythm.     Heart sounds: Normal heart sounds.  Pulmonary:     Effort: Pulmonary effort is normal. No respiratory distress.     Breath sounds: No wheezing.  Abdominal:     General: Bowel sounds are normal.     Palpations: Abdomen is soft.     Tenderness: There is abdominal tenderness. There is no guarding or rebound.  Musculoskeletal:     Cervical back: Neck supple.  Skin:    General: Skin is warm and dry.  Neurological:     Mental Status: She is alert and oriented to person, place, and time.  Psychiatric:        Mood and Affect: Mood normal.     ED Results / Procedures / Treatments   Labs (all labs ordered are listed, but only abnormal results are displayed) Labs Reviewed  CBC WITH DIFFERENTIAL/PLATELET - Abnormal; Notable for the following components:      Result Value   WBC 14.4 (*)    Platelets 408 (*)    Neutro Abs 8.4 (*)    Lymphs Abs 5.0 (*)    Abs Immature Granulocytes 0.10 (*)    All other components within normal limits  COMPREHENSIVE METABOLIC PANEL - Abnormal; Notable for the following components:   Glucose, Bld 115 (*)    All other components within normal limits  URINALYSIS, ROUTINE W REFLEX MICROSCOPIC - Abnormal; Notable for the following components:   Specific Gravity, Urine <1.005 (*)    Hgb urine dipstick TRACE (*)    Leukocytes,Ua TRACE (*)    All other components within normal limits  URINALYSIS, MICROSCOPIC (REFLEX) - Abnormal; Notable for the following components:   Bacteria, UA FEW (*)    All other components within normal limits  LIPASE, BLOOD  PREGNANCY, URINE    EKG None  Radiology No results found.  Procedures Procedures (including critical care time)  Medications Ordered in ED Medications  sodium chloride 0.9 % bolus 1,000 mL (0 mLs Intravenous Stopped 12/02/19 0502)  morphine 4 MG/ML injection 4 mg (4 mg Intravenous  Given 12/02/19 0335)  ondansetron (ZOFRAN) injection 4 mg (4 mg Intravenous Given 12/02/19 0335)  alum & mag hydroxide-simeth (MAALOX/MYLANTA) 200-200-20 MG/5ML suspension 30 mL (30 mLs Oral Given 12/02/19 0342)    And  lidocaine (XYLOCAINE) 2 % viscous mouth solution 15 mL (15 mLs Oral Given 12/02/19 0342)  oxyCODONE-acetaminophen (PERCOCET/ROXICET) 5-325 MG per tablet 1 tablet (1  tablet Oral Given 12/02/19 0501)    ED Course  I have reviewed the triage vital signs and the nursing notes.  Pertinent labs & imaging results that were available during my care of the patient were reviewed by me and considered in my medical decision making (see chart for details).    MDM Rules/Calculators/A&P                       Patient presents with epigastric abdominal pain worse after eating.  She is overall nontoxic.  Vital signs notable for mild tachycardia at 105.  She has tenderness on exam but no peritonitis.  Considerations include reflux, gastritis, cholecystitis, pancreatitis.  Patient was given pain and nausea medication.  Lab work obtained.  Leukocytosis to 14.4.  No significant LFT abnormalities.  No significant metabolic derangements.  Patient improved on recheck.  I discussed with her my concern for possible gallbladder pathology.  At this time do not have access to ultrasound imaging.  Gave her the option of CT imaging versus continued symptom control and outpatient ultrasound imaging given largely reassuring work-up otherwise.  Patient would like to forego CT imaging and obtain ultrasound.  She was able to tolerate fluids.  She has a significant history of reflux and is not taking her reflux medication.  Will restart omeprazole and discharge with Zofran.  We will have her return for right upper quadrant ultrasound for possible biliary colic.  After history, exam, and medical workup I feel the patient has been appropriately medically screened and is safe for discharge home. Pertinent diagnoses were  discussed with the patient. Patient was given return precautions.   Final Clinical Impression(s) / ED Diagnoses Final diagnoses:  Epigastric pain    Rx / DC Orders ED Discharge Orders         Ordered    US Abdomen Limited RUQ/Gall Bladder     12/02/19 0514    omeprazole (PRILOSEC) 20 MG capsule  Daily     12/02/19 0514    ondansetron (ZOFRAN ODT) 4 MG disintegrating tablet  Every 8 hours PRN     12/02/19 0514           Shon Baton, MD 12/02/19 [phone]

## 2019-12-02 NOTE — Discharge Instructions (Addendum)
You were seen today for abdominal pain.  Your pain is likely related to reflux but could also be your gallbladder.  Return later today for ultrasound.  Take medications as prescribed.  If you have any new or worsening symptoms you should be reevaluated.

## 2019-12-02 NOTE — ED Provider Notes (Signed)
5:09 PM Patient returned for her [place] today. I was asked to give results -- which are negative. I checked in lobby x 2. Pt not present. Registration clerk stated that patient is no longer sitting in the location she was previously. Asked to call back if she returns.   5:25 PM patient back in lobby.  I gave her results in the triage room.  She continues to have pain and appears generally uncomfortable.  Discussed that that gallbladder ultrasound does not give [place] a cause of her pain today.  Encouraged her to follow-up with her doctor or return/check in if she has uncontrolled symptoms including severe pain, fever, vomiting.  Patient was able to walk out of the exam room without any difficulty.   Renne Crigler, PA-C 12/02/19 1727    Shon Baton, MD 12/03/19 [phone]

## 2019-12-03 ENCOUNTER — Ambulatory Visit (HOSPITAL_BASED_OUTPATIENT_CLINIC_OR_DEPARTMENT_OTHER): Payer: Medicaid Other

## 2020-05-15 ENCOUNTER — Emergency Department (HOSPITAL_BASED_OUTPATIENT_CLINIC_OR_DEPARTMENT_OTHER): Payer: Medicaid Other

## 2020-05-15 ENCOUNTER — Encounter (HOSPITAL_BASED_OUTPATIENT_CLINIC_OR_DEPARTMENT_OTHER): Payer: Self-pay | Admitting: *Deleted

## 2020-05-15 ENCOUNTER — Other Ambulatory Visit: Payer: Self-pay

## 2020-05-15 ENCOUNTER — Emergency Department (HOSPITAL_BASED_OUTPATIENT_CLINIC_OR_DEPARTMENT_OTHER)
Admission: EM | Admit: 2020-05-15 | Discharge: 2020-05-15 | Disposition: A | Discharge status: Home / Self Care | Payer: Medicaid Other | Attending: Emergency Medicine | Admitting: Emergency Medicine

## 2020-05-15 DIAGNOSIS — R109 Unspecified abdominal pain: Secondary | ICD-10-CM | POA: Insufficient documentation

## 2020-05-15 DIAGNOSIS — F17210 Nicotine dependence, cigarettes, uncomplicated: Secondary | ICD-10-CM | POA: Diagnosis not present

## 2020-05-15 DIAGNOSIS — K529 Noninfective gastroenteritis and colitis, unspecified: Secondary | ICD-10-CM

## 2020-05-15 DIAGNOSIS — K219 Gastro-esophageal reflux disease without esophagitis: Secondary | ICD-10-CM | POA: Diagnosis not present

## 2020-05-15 LAB — CBC
HCT: 39.1 % (ref 36.0–46.0)
Hemoglobin: 12.7 g/dL (ref 12.0–15.0)
MCH: 26.1 pg (ref 26.0–34.0)
MCHC: 32.5 g/dL (ref 30.0–36.0)
MCV: 80.5 fL (ref 80.0–100.0)
Platelets: 400 10*3/uL (ref 150–400)
RBC: 4.86 MIL/uL (ref 3.87–5.11)
RDW: 14.8 % (ref 11.5–15.5)
WBC: 13.7 10*3/uL — ABNORMAL HIGH (ref 4.0–10.5)
nRBC: 0 % (ref 0.0–0.2)

## 2020-05-15 LAB — URINALYSIS, ROUTINE W REFLEX MICROSCOPIC
Bilirubin Urine: NEGATIVE
Glucose, UA: NEGATIVE mg/dL
Ketones, ur: NEGATIVE mg/dL
Nitrite: NEGATIVE
Protein, ur: NEGATIVE mg/dL
Specific Gravity, Urine: <1.005 — ABNORMAL LOW (ref 1.005–1.030)
pH: 6 (ref 5.0–8.0)

## 2020-05-15 LAB — COMPREHENSIVE METABOLIC PANEL
ALT: 26 U/L (ref 0–44)
AST: 22 U/L (ref 15–41)
Albumin: 4.1 g/dL (ref 3.5–5.0)
Alkaline Phosphatase: 74 U/L (ref 38–126)
Anion gap: 14 (ref 5–15)
BUN: 12 mg/dL (ref 6–20)
CO2: 21 mmol/L — ABNORMAL LOW (ref 22–32)
Calcium: 9.4 mg/dL (ref 8.9–10.3)
Chloride: 98 mmol/L (ref 98–111)
Creatinine, Ser: 0.61 mg/dL (ref 0.44–1.00)
GFR, Estimated: >60 mL/min (ref >60)
Glucose, Bld: 113 mg/dL — ABNORMAL HIGH (ref 70–99)
Potassium: 3.9 mmol/L (ref 3.5–5.1)
Sodium: 133 mmol/L — ABNORMAL LOW (ref 135–145)
Total Bilirubin: 0.3 mg/dL (ref 0.3–1.2)
Total Protein: 7.6 g/dL (ref 6.5–8.1)

## 2020-05-15 LAB — URINALYSIS, MICROSCOPIC (REFLEX)

## 2020-05-15 MED ORDER — ONDANSETRON 4 MG PO TBDP: 4.0000 mg | ORAL_TABLET | Freq: Three times a day (TID) | ORAL | 0 refills | Status: DC | PRN

## 2020-05-15 MED ORDER — HYDROMORPHONE HCL 1 MG/ML IJ SOLN
1.0000 mg | Freq: Once | INTRAMUSCULAR | Status: AC
  Administered 2020-05-15: 1 mg via INTRAVENOUS
  Filled 2020-05-15: qty 1

## 2020-05-15 MED ORDER — KETOROLAC TROMETHAMINE 15 MG/ML IJ SOLN
15.0000 mg | Freq: Once | INTRAMUSCULAR | Status: AC
  Administered 2020-05-15: 15 mg via INTRAVENOUS
  Filled 2020-05-15: qty 1

## 2020-05-15 MED ORDER — SODIUM CHLORIDE 0.9 % IV BOLUS
1000.0000 mL | Freq: Once | INTRAVENOUS | Status: AC
  Administered 2020-05-15: 1000 mL via INTRAVENOUS

## 2020-05-15 MED ORDER — ONDANSETRON HCL 4 MG/2ML IJ SOLN
4.0000 mg | Freq: Once | INTRAMUSCULAR | Status: AC
  Administered 2020-05-15: 4 mg via INTRAVENOUS
  Filled 2020-05-15: qty 2

## 2020-05-15 MED ORDER — PROMETHAZINE HCL 25 MG PO TABS: 25.0000 mg | ORAL_TABLET | Freq: Four times a day (QID) | ORAL | 0 refills | Status: DC | PRN

## 2020-05-15 NOTE — ED Notes (Signed)
Pt to Radiology

## 2020-05-15 NOTE — ED Triage Notes (Signed)
Abdominal pain and nausea. Hx of fibromyalgia and chronic pain. She has not been able to take her pain medication x 2 days due to nausea.

## 2020-05-15 NOTE — Discharge Instructions (Signed)
You were evaluated in the Emergency Department and after careful evaluation, we did not find any emergent condition requiring admission or further testing in the hospital.  Your exam/testing today was overall reassuring.  Symptoms seem to be due to a viral illness. Please take the zofran as needed for nausea and drink plenty of water, use your prescribed pain medications at home.  For more significant nausea you can use the phenergan as needed.  Please return to the Emergency Department if you experience any worsening of your condition.  Thank you for allowing [place] to be a part of your care.

## 2020-05-15 NOTE — ED Provider Notes (Signed)
[facility] DEPT [facility] Emergency Department Provider Note MRN:  [id number]  Arrival date & time: 05/15/20     Chief Complaint   Emesis and Abdominal Pain   History of Present Illness   [facility] is a 44 y.o. year-old female with a history of fibromyalgia presenting to the ED with chief complaint of emesis and abdominal pain.  Patient explains that a friend/coworker was recently experiencing similar symptoms of nausea, vomiting, diarrhea, abdominal discomfort and a few days later patient began experiencing the same.  She has a history of chronic pain and fibromyalgia and is used to taking a number of daily medications but due to the symptoms she has been unable to take these.  She is endorsing moderate to severe abdominal pain, persistent nausea.  Mild diarrhea.  Denies chest pain or shortness of breath.  Symptoms are constant, no exacerbating relieving factors.  Review of Systems  A complete 10 system review of systems was obtained and all systems are negative except as noted in the HPI and PMH.   Patient's Health History    Past Medical History:  Diagnosis Date  . Chronic back pain   . DJD (degenerative joint disease)   . Fibromyalgia   . GERD (gastroesophageal reflux disease)   . Migraine     Past Surgical History:  Procedure Laterality Date  . abdominal laproscopy x 2    . APPENDECTOMY    . CESAREAN SECTION     x 3  . TUBAL LIGATION      No family history on file.  Social History   Socioeconomic History  . Marital status: Single    Spouse name: Not on file  . Number of children: Not on file  . Years of education: Not on file  . Highest education level: Not on file  Occupational History  . Not on file  Tobacco Use  . Smoking status: Current Every Day Smoker    Packs/day: 1.00    Types: Cigarettes  . Smokeless tobacco: Never Used  Vaping Use  . Vaping Use: Never used  Substance and Sexual Activity  . Alcohol use: Yes    Comment: Very  Rare  . Drug use: Never  . Sexual activity: Not on file  Other Topics Concern  . Not on file  Social History Narrative  . Not on file   Social Determinants of Health   Financial Resource Strain:   . Difficulty of Paying Living Expenses: Not on file  Food Insecurity:   . Worried About Programme researcher, broadcasting/film/video in the Last Year: Not on file  . Ran Out of Food in the Last Year: Not on file  Transportation Needs:   . Lack of Transportation (Medical): Not on file  . Lack of Transportation (Non-Medical): Not on file  Physical Activity:   . Days of Exercise per Week: Not on file  . Minutes of Exercise per Session: Not on file  Stress:   . Feeling of Stress : Not on file  Social Connections:   . Frequency of Communication with Friends and Family: Not on file  . Frequency of Social Gatherings with Friends and Family: Not on file  . Attends Religious Services: Not on file  . Active Member of Clubs or Organizations: Not on file  . Attends Banker Meetings: Not on file  . Marital Status: Not on file  Intimate Partner Violence:   . Fear of Current or Ex-Partner: Not on file  . Emotionally Abused:  Not on file  . Physically Abused: Not on file  . Sexually Abused: Not on file     Physical Exam   Vitals:   05/15/20 1211 05/15/20 1330  BP: (!) 148/83 124/83  Pulse: (!) 107 (!) 102  Resp: 18   Temp: 98 F (36.7 C)   SpO2: 100% 93%    CONSTITUTIONAL: Well-appearing, NAD NEURO:  Alert and oriented x 3, no focal deficits EYES:  eyes equal and reactive ENT/NECK:  no LAD, no JVD CARDIO: Tachycardic rate, well-perfused, normal S1 and S2 PULM:  CTAB no wheezing or rhonchi GI/GU:  normal bowel sounds, mild diffuse tenderness MSK/SPINE:  No gross deformities, no edema SKIN:  no rash, atraumatic PSYCH:  Appropriate speech and behavior  *Additional and/or pertinent findings included in MDM below  Diagnostic and Interventional Summary    EKG Interpretation  Date/Time:      Ventricular Rate:    PR Interval:    QRS Duration:   QT Interval:    QTC Calculation:   R Axis:     Text Interpretation:        Labs Reviewed  URINALYSIS, ROUTINE W REFLEX MICROSCOPIC - Abnormal; Notable for the following components:      Result Value   Color, Urine STRAW (*)    Specific Gravity, Urine <1.005 (*)    Hgb urine dipstick MODERATE (*)    Leukocytes,Ua SMALL (*)    All other components within normal limits  CBC - Abnormal; Notable for the following components:   WBC 13.7 (*)    All other components within normal limits  COMPREHENSIVE METABOLIC PANEL - Abnormal; Notable for the following components:   Sodium 133 (*)    CO2 21 (*)    Glucose, Bld 113 (*)    All other components within normal limits  URINALYSIS, MICROSCOPIC (REFLEX) - Abnormal; Notable for the following components:   Bacteria, UA RARE (*)    All other components within normal limits    DG ABD ACUTE 2+V W 1V CHEST    (Results Pending)    Medications  sodium chloride 0.9 % bolus 1,000 mL (1,000 mLs Intravenous New Bag/Given 05/15/20 1314)  ketorolac (TORADOL) 15 MG/ML injection 15 mg (15 mg Intravenous Given 05/15/20 1322)  HYDROmorphone (DILAUDID) injection 1 mg (1 mg Intravenous Given 05/15/20 1325)  ondansetron (ZOFRAN) injection 4 mg (4 mg Intravenous Given 05/15/20 1320)     Procedures  /  Critical Care Procedures  ED Course and Medical Decision Making  I have reviewed the triage vital signs, the nursing notes, and pertinent available records from the EMR.  Listed above are laboratory and imaging tests that I personally ordered, reviewed, and interpreted and then considered in my medical decision making (see below for details).  Suspect viral gastroenteritis with poor p.o. intake causing her to be behind in her chronic pain management.  Providing fluids, obtaining screening labs and x-ray and will reassess.     Work up reassuring, patient feeling better with repeat abdominal exam again  reassuring.  Appropriate for discharge with symptomatic management at home  Elmer Sow. Pilar Plate, MD [facility] Emergency Medicine [facility] [email or web address]  Final Clinical Impressions(s) / ED Diagnoses     ICD-10-CM   1. Abdominal pain  R10.9 DG ABD ACUTE 2+V W 1V CHEST    DG ABD ACUTE 2+V W 1V CHEST    ED Discharge Orders    None       Discharge Instructions Discussed with and Provided to  Patient:   Discharge Instructions   None       Sabas Sous, MD 05/15/20 1505

## 2020-05-16 ENCOUNTER — Encounter (HOSPITAL_BASED_OUTPATIENT_CLINIC_OR_DEPARTMENT_OTHER): Payer: Self-pay | Admitting: Emergency Medicine

## 2020-05-16 ENCOUNTER — Other Ambulatory Visit: Payer: Self-pay

## 2020-05-16 ENCOUNTER — Emergency Department (HOSPITAL_BASED_OUTPATIENT_CLINIC_OR_DEPARTMENT_OTHER)
Admission: EM | Admit: 2020-05-16 | Discharge: 2020-05-16 | Disposition: A | Discharge status: Home / Self Care | Payer: Medicaid Other | Attending: Emergency Medicine | Admitting: Emergency Medicine

## 2020-05-16 DIAGNOSIS — Z20822 Contact with and (suspected) exposure to covid-19: Secondary | ICD-10-CM | POA: Insufficient documentation

## 2020-05-16 DIAGNOSIS — R112 Nausea with vomiting, unspecified: Secondary | ICD-10-CM | POA: Diagnosis present

## 2020-05-16 DIAGNOSIS — R519 Headache, unspecified: Secondary | ICD-10-CM | POA: Diagnosis not present

## 2020-05-16 DIAGNOSIS — M797 Fibromyalgia: Secondary | ICD-10-CM | POA: Insufficient documentation

## 2020-05-16 DIAGNOSIS — K2900 Acute gastritis without bleeding: Secondary | ICD-10-CM | POA: Diagnosis not present

## 2020-05-16 DIAGNOSIS — G894 Chronic pain syndrome: Secondary | ICD-10-CM | POA: Insufficient documentation

## 2020-05-16 DIAGNOSIS — F17210 Nicotine dependence, cigarettes, uncomplicated: Secondary | ICD-10-CM | POA: Diagnosis not present

## 2020-05-16 LAB — COMPREHENSIVE METABOLIC PANEL
ALT: 25 U/L (ref 0–44)
AST: 22 U/L (ref 15–41)
Albumin: 4.2 g/dL (ref 3.5–5.0)
Alkaline Phosphatase: 73 U/L (ref 38–126)
Anion gap: 12 (ref 5–15)
BUN: 12 mg/dL (ref 6–20)
CO2: 23 mmol/L (ref 22–32)
Calcium: 9.2 mg/dL (ref 8.9–10.3)
Chloride: 103 mmol/L (ref 98–111)
Creatinine, Ser: 0.83 mg/dL (ref 0.44–1.00)
GFR, Estimated: >60 mL/min (ref >60)
Glucose, Bld: 130 mg/dL — ABNORMAL HIGH (ref 70–99)
Potassium: 3.9 mmol/L (ref 3.5–5.1)
Sodium: 138 mmol/L (ref 135–145)
Total Bilirubin: 0.3 mg/dL (ref 0.3–1.2)
Total Protein: 7.4 g/dL (ref 6.5–8.1)

## 2020-05-16 LAB — URINALYSIS, ROUTINE W REFLEX MICROSCOPIC
Bilirubin Urine: NEGATIVE
Glucose, UA: NEGATIVE mg/dL
Ketones, ur: NEGATIVE mg/dL
Leukocytes,Ua: NEGATIVE
Nitrite: NEGATIVE
Protein, ur: NEGATIVE mg/dL
Specific Gravity, Urine: 1.01 (ref 1.005–1.030)
pH: 6 (ref 5.0–8.0)

## 2020-05-16 LAB — CBC WITH DIFFERENTIAL/PLATELET
Abs Immature Granulocytes: 0.07 10*3/uL (ref 0.00–0.07)
Basophils Absolute: 0.1 10*3/uL (ref 0.0–0.1)
Basophils Relative: 1 %
Eosinophils Absolute: 0.1 10*3/uL (ref 0.0–0.5)
Eosinophils Relative: 1 %
HCT: 42.4 % (ref 36.0–46.0)
Hemoglobin: 13.4 g/dL (ref 12.0–15.0)
Immature Granulocytes: 1 %
Lymphocytes Relative: 27 %
Lymphs Abs: 3.3 10*3/uL (ref 0.7–4.0)
MCH: 25.9 pg — ABNORMAL LOW (ref 26.0–34.0)
MCHC: 31.6 g/dL (ref 30.0–36.0)
MCV: 82 fL (ref 80.0–100.0)
Monocytes Absolute: 0.4 10*3/uL (ref 0.1–1.0)
Monocytes Relative: 3 %
Neutro Abs: 8.3 10*3/uL — ABNORMAL HIGH (ref 1.7–7.7)
Neutrophils Relative %: 67 %
Platelets: 412 10*3/uL — ABNORMAL HIGH (ref 150–400)
RBC: 5.17 MIL/uL — ABNORMAL HIGH (ref 3.87–5.11)
RDW: 15.2 % (ref 11.5–15.5)
WBC: 12.3 10*3/uL — ABNORMAL HIGH (ref 4.0–10.5)
nRBC: 0 % (ref 0.0–0.2)

## 2020-05-16 LAB — URINALYSIS, MICROSCOPIC (REFLEX)

## 2020-05-16 LAB — PREGNANCY, URINE: Preg Test, Ur: NEGATIVE

## 2020-05-16 LAB — LIPASE, BLOOD: Lipase: 40 U/L (ref 11–51)

## 2020-05-16 LAB — RESPIRATORY PANEL BY RT PCR (FLU A&B, COVID)
Influenza A by PCR: NEGATIVE
Influenza B by PCR: NEGATIVE
SARS Coronavirus 2 by RT PCR: NEGATIVE

## 2020-05-16 MED ORDER — MAGNESIUM SULFATE 2 GM/50ML IV SOLN
2.0000 g | Freq: Once | INTRAVENOUS | Status: AC
  Administered 2020-05-16: 2 g via INTRAVENOUS
  Filled 2020-05-16: qty 50

## 2020-05-16 MED ORDER — HYDROCODONE-ACETAMINOPHEN 5-325 MG PO TABS
1.0000 | ORAL_TABLET | Freq: Once | ORAL | Status: AC
  Administered 2020-05-16: 1 via ORAL
  Filled 2020-05-16: qty 1

## 2020-05-16 MED ORDER — DIPHENHYDRAMINE HCL 50 MG/ML IJ SOLN
25.0000 mg | Freq: Once | INTRAMUSCULAR | Status: AC
  Administered 2020-05-16: 25 mg via INTRAVENOUS
  Filled 2020-05-16: qty 1

## 2020-05-16 MED ORDER — PROCHLORPERAZINE EDISYLATE 10 MG/2ML IJ SOLN
10.0000 mg | Freq: Once | INTRAMUSCULAR | Status: AC
  Administered 2020-05-16: 10 mg via INTRAVENOUS
  Filled 2020-05-16: qty 2

## 2020-05-16 MED ORDER — KETOROLAC TROMETHAMINE 30 MG/ML IJ SOLN
30.0000 mg | Freq: Once | INTRAMUSCULAR | Status: AC
  Administered 2020-05-16: 30 mg via INTRAVENOUS
  Filled 2020-05-16: qty 1

## 2020-05-16 MED ORDER — OXYCODONE HCL 5 MG PO TABS
10.0000 mg | ORAL_TABLET | Freq: Once | ORAL | Status: AC
  Administered 2020-05-16: 10 mg via ORAL
  Filled 2020-05-16: qty 2

## 2020-05-16 MED ORDER — SODIUM CHLORIDE 0.9 % IV BOLUS
1000.0000 mL | Freq: Once | INTRAVENOUS | Status: AC
  Administered 2020-05-16: 1000 mL via INTRAVENOUS

## 2020-05-16 MED ORDER — HALOPERIDOL LACTATE 5 MG/ML IJ SOLN
4.0000 mg | Freq: Once | INTRAMUSCULAR | Status: DC
  Filled 2020-05-16: qty 1

## 2020-05-16 NOTE — ED Notes (Signed)
Pt reports that she cannot get her fibromyalgia pain under control.  She started having abdominal pain on Wednesday and experienced some nausea while driving here today.

## 2020-05-16 NOTE — ED Provider Notes (Signed)
[facility] EMERGENCY DEPARTMENT Provider Note   CSN: [id number] Arrival date & time: 05/16/20  4496     History Chief Complaint  Patient presents with  . Abdominal Pain  . Headache    Beth Porter is a 44 y.o. female.  HPI     Coworkers have stomach bug, began having nausea/vomiting Wednesday night, only threw up once yesterday and has not thrown up since visit yesterday but feels like needs to.  Did eat a little bit yesterday and kept it down after. Cannot control chronic pain because of nausea, vomiting and can't keep any medications down Throat hurts and has not been able to swallow the phenergan that was rx Regular medications large, has not wanted to swallow them, throat sore after vomiting Headache, feels like migraines coming on front and radiating to back, pain severe with headache as well as chronic pain, back pain and arthralgias exacerbated as she has not been able to take medications Mild cough, not changed Had zofran and phenergan rx   Past Medical History:  Diagnosis Date  . Chronic back pain   . DJD (degenerative joint disease)   . Fibromyalgia   . GERD (gastroesophageal reflux disease)   . Migraine     There are no problems to display for this patient.   Past Surgical History:  Procedure Laterality Date  . abdominal laproscopy x 2    . APPENDECTOMY    . CESAREAN SECTION     x 3  . TUBAL LIGATION       OB History   No obstetric history on file.     No family history on file.  Social History   Tobacco Use  . Smoking status: Current Every Day Smoker    Packs/day: 1.00    Types: Cigarettes  . Smokeless tobacco: Never Used  Vaping Use  . Vaping Use: Never used  Substance Use Topics  . Alcohol use: Yes    Comment: Very Rare  . Drug use: Never    Home Medications Prior to Admission medications   Medication Sig Start Date End Date Taking? Authorizing Provider  HYDROcodone-acetaminophen (NORCO) 10-325 MG tablet Take 1  tablet by mouth every 2 (two) hours as needed for moderate pain.    [provider]  ibuprofen (ADVIL,MOTRIN) 600 MG tablet Take 1 tablet (600 mg total) by mouth every 8 (eight) hours as needed. Take with food. 04/09/18   Cathren Laine, MD  ondansetron (ZOFRAN ODT) 4 MG disintegrating tablet Take 1 tablet (4 mg total) by mouth every 8 (eight) hours as needed for nausea or vomiting. 05/15/20   Sabas Sous, MD  pregabalin (LYRICA) 50 MG capsule Take 225 mg by mouth 2 (two) times daily.     [provider]  promethazine (PHENERGAN) 25 MG tablet Take 1 tablet (25 mg total) by mouth every 6 (six) hours as needed for nausea or vomiting. 05/15/20   Sabas Sous, MD    Allergies    Aspirin and Metronidazole  Review of Systems   Review of Systems  Constitutional: Positive for appetite change. Negative for fever.  HENT: Positive for sore throat. Negative for congestion.   Eyes: Negative for visual disturbance.  Respiratory: Negative for cough and shortness of breath.   Gastrointestinal: Positive for abdominal pain (mild gas pain), nausea and vomiting. Negative for constipation and diarrhea.  Genitourinary: Negative for dysuria.  Musculoskeletal: Positive for back pain (chronic).  Skin: Negative for rash.  Neurological: Positive for headaches. Negative  for weakness and numbness.    Physical Exam Updated Vital Signs BP 122/76 (BP Location: Left Arm)   Pulse 80   Temp 98.4 F (36.9 C) (Oral)   Resp 18   Ht 4\' 10"  (1.473 m)   Wt 77.1 kg   LMP 05/04/2020   SpO2 94%   BMI 35.53 kg/m   Physical Exam Vitals and nursing note reviewed.  Constitutional:      General: She is not in acute distress.    Appearance: She is well-developed. She is not diaphoretic.  HENT:     Head: Normocephalic and atraumatic.  Eyes:     Conjunctiva/sclera: Conjunctivae normal.  Cardiovascular:     Rate and Rhythm: Normal rate and regular rhythm.     Heart sounds: Normal heart sounds. No  murmur heard.  No friction rub. No gallop.   Pulmonary:     Effort: Pulmonary effort is normal. No respiratory distress.     Breath sounds: Normal breath sounds. No wheezing or rales.  Abdominal:     General: There is no distension.     Palpations: Abdomen is soft.     Tenderness: There is no abdominal tenderness. There is no guarding.  Musculoskeletal:        General: No tenderness.     Cervical back: Normal range of motion.  Skin:    General: Skin is warm and dry.     Findings: No erythema or rash.  Neurological:     Mental Status: She is alert and oriented to person, place, and time.     ED Results / Procedures / Treatments   Labs (all labs ordered are listed, but only abnormal results are displayed) Labs Reviewed  CBC WITH DIFFERENTIAL/PLATELET - Abnormal; Notable for the following components:      Result Value   WBC 12.3 (*)    RBC 5.17 (*)    MCH 25.9 (*)    Platelets 412 (*)    Neutro Abs 8.3 (*)    All other components within normal limits  COMPREHENSIVE METABOLIC PANEL - Abnormal; Notable for the following components:   Glucose, Bld 130 (*)    All other components within normal limits  URINALYSIS, ROUTINE W REFLEX MICROSCOPIC - Abnormal; Notable for the following components:   Hgb urine dipstick SMALL (*)    All other components within normal limits  URINALYSIS, MICROSCOPIC (REFLEX) - Abnormal; Notable for the following components:   Bacteria, UA FEW (*)    All other components within normal limits  RESPIRATORY PANEL BY RT PCR (FLU A&B, COVID)  PREGNANCY, URINE  LIPASE, BLOOD    EKG None  Radiology DG ABD ACUTE 2+V W 1V CHEST  Result Date: 05/15/2020 CLINICAL DATA:  Abdominal pain and nausea EXAM: DG ABDOMEN ACUTE WITH 1 VIEW CHEST COMPARISON:  None. FINDINGS: There is no evidence of dilated bowel loops or free intraperitoneal air. No radiopaque calculi or other significant radiographic abnormality is seen. Heart size and mediastinal contours are within  normal limits. Both lungs are clear. IMPRESSION: Negative abdominal radiographs.  No acute cardiopulmonary disease. Electronically Signed   By: 05/17/2020 M.D.   On: 05/15/2020 14:29    Procedures Procedures (including critical care time)  Medications Ordered in ED Medications  prochlorperazine (COMPAZINE) injection 10 mg (10 mg Intravenous Given 05/16/20 0928)  diphenhydrAMINE (BENADRYL) injection 25 mg (25 mg Intravenous Given 05/16/20 0927)  sodium chloride 0.9 % bolus 1,000 mL (0 mLs Intravenous Stopped 05/16/20 1039)  oxyCODONE (Oxy IR/ROXICODONE) immediate release  tablet 10 mg (10 mg Oral Given 05/16/20 0925)  ketorolac (TORADOL) 30 MG/ML injection 30 mg (30 mg Intravenous Given 05/16/20 1144)  magnesium sulfate IVPB 2 g 50 mL (0 g Intravenous Stopped 05/16/20 1208)  HYDROcodone-acetaminophen (NORCO/VICODIN) 5-325 MG per tablet 1 tablet (1 tablet Oral Given 05/16/20 1144)    ED Course  I have reviewed the triage vital signs and the nursing notes.  Pertinent labs & imaging results that were available during my care of the patient were reviewed by me and considered in my medical decision making (see chart for details).    MDM Rules/Calculators/A&P                          44yo female with history of DJD, fibromyalgia, visit yesterday for abdominal pain, nausea and vomiting presents with continuing symptoms with headache consistent with prior migraines.  Labs without acute findings. Abdominal exam benign, no sign of SBO or other acute intraabdominal process. Given history of sick contacts with gastroenteritis continue to feel this is most likely etiology of symptoms with chronic pain and migraine exacerbated by dehydration and difficulty taking regular medications.  Headache began slowly, no trauma, no fevers, no neurologic symptoms, and have low suspicion for [facility], SDH or meningitis.  Given migraine cocktail and po home medication with improvement in symptoms. Recommend continued  antiemetics and supportive care. Patient discharged in stable condition with understanding of reasons to return.     Final Clinical Impression(s) / ED Diagnoses Final diagnoses:  Nausea and vomiting, intractability of vomiting not specified, unspecified vomiting type  Chronic pain syndrome  Acute gastritis without hemorrhage, unspecified gastritis type  Fibromyalgia    Rx / DC Orders ED Discharge Orders    None       Alvira Monday, MD 05/16/20 2142

## 2020-05-16 NOTE — ED Triage Notes (Signed)
Pt returns with continuation of symptoms of abdominal pain, nausea, headache and back pain.  Pt has history of fibromyalgia and reports that she cannot get the symptoms under control.

## 2020-05-17 ENCOUNTER — Encounter (HOSPITAL_BASED_OUTPATIENT_CLINIC_OR_DEPARTMENT_OTHER): Payer: Self-pay | Admitting: Emergency Medicine

## 2020-05-17 ENCOUNTER — Emergency Department (HOSPITAL_BASED_OUTPATIENT_CLINIC_OR_DEPARTMENT_OTHER)
Admission: EM | Admit: 2020-05-17 | Discharge: 2020-05-17 | Disposition: A | Discharge status: Home / Self Care | Payer: Medicaid Other | Attending: Emergency Medicine | Admitting: Emergency Medicine

## 2020-05-17 DIAGNOSIS — Z5321 Procedure and treatment not carried out due to patient leaving prior to being seen by health care provider: Secondary | ICD-10-CM | POA: Insufficient documentation

## 2020-05-17 DIAGNOSIS — W182XXA Fall in (into) shower or empty bathtub, initial encounter: Secondary | ICD-10-CM | POA: Diagnosis not present

## 2020-05-17 DIAGNOSIS — M549 Dorsalgia, unspecified: Secondary | ICD-10-CM | POA: Insufficient documentation

## 2020-05-17 NOTE — ED Triage Notes (Signed)
Pt reports she has recently had a GI bug. She fell today trying to get in the bath tub. C/o R side back pain that radiates down her leg.

## 2020-05-18 ENCOUNTER — Encounter (HOSPITAL_BASED_OUTPATIENT_CLINIC_OR_DEPARTMENT_OTHER): Payer: Self-pay

## 2020-05-18 ENCOUNTER — Other Ambulatory Visit: Payer: Self-pay

## 2020-05-18 ENCOUNTER — Emergency Department (HOSPITAL_BASED_OUTPATIENT_CLINIC_OR_DEPARTMENT_OTHER)
Admission: EM | Admit: 2020-05-18 | Discharge: 2020-05-18 | Disposition: A | Discharge status: Home / Self Care | Payer: Medicaid Other | Attending: Emergency Medicine | Admitting: Emergency Medicine

## 2020-05-18 DIAGNOSIS — Z5321 Procedure and treatment not carried out due to patient leaving prior to being seen by health care provider: Secondary | ICD-10-CM | POA: Insufficient documentation

## 2020-05-18 DIAGNOSIS — R1110 Vomiting, unspecified: Secondary | ICD-10-CM | POA: Diagnosis present

## 2020-05-18 NOTE — ED Triage Notes (Signed)
Pt arrives with c/o GI bug X2 weeks states that she feel yesterday getting in the bathtub, came to ED but had to leave r/t family reasons.

## 2020-07-15 ENCOUNTER — Emergency Department (HOSPITAL_BASED_OUTPATIENT_CLINIC_OR_DEPARTMENT_OTHER)
Admission: EM | Admit: 2020-07-15 | Discharge: 2020-07-15 | Disposition: A | Discharge status: Home / Self Care | Payer: Medicaid Other | Attending: Emergency Medicine | Admitting: Emergency Medicine

## 2020-07-15 ENCOUNTER — Encounter (HOSPITAL_BASED_OUTPATIENT_CLINIC_OR_DEPARTMENT_OTHER): Payer: Self-pay | Admitting: Emergency Medicine

## 2020-07-15 ENCOUNTER — Other Ambulatory Visit: Payer: Self-pay

## 2020-07-15 DIAGNOSIS — F17210 Nicotine dependence, cigarettes, uncomplicated: Secondary | ICD-10-CM | POA: Diagnosis not present

## 2020-07-15 DIAGNOSIS — R519 Headache, unspecified: Secondary | ICD-10-CM | POA: Insufficient documentation

## 2020-07-15 DIAGNOSIS — M545 Low back pain, unspecified: Secondary | ICD-10-CM | POA: Diagnosis not present

## 2020-07-15 DIAGNOSIS — G8929 Other chronic pain: Secondary | ICD-10-CM | POA: Diagnosis not present

## 2020-07-15 MED ORDER — KETOROLAC TROMETHAMINE 30 MG/ML IJ SOLN
30.0000 mg | Freq: Once | INTRAMUSCULAR | Status: AC
  Administered 2020-07-15: 07:00:00 30 mg via INTRAMUSCULAR
  Filled 2020-07-15: qty 1

## 2020-07-15 MED ORDER — HALOPERIDOL LACTATE 5 MG/ML IJ SOLN
5.0000 mg | Freq: Once | INTRAMUSCULAR | Status: AC
  Administered 2020-07-15: 07:00:00 5 mg via INTRAMUSCULAR
  Filled 2020-07-15: qty 1

## 2020-07-15 NOTE — ED Provider Notes (Signed)
[facility]GENC[facility] DEPT [facility] Emergency Department Provider Note MRN:  [id number]  Arrival date & time: 07/15/20     Chief Complaint   Headache   History of Present Illness   Beth Porter is a 44 y.o. year-old female with a history of fibromyalgia, migraines presenting to the ED with chief complaint of headache.  Gradual onset headache that began 2 or 3 days ago shortly after having her eyes dilated at an ophthalmology appointment.  Initially did not really noticed a headache but it slowly got worse.  Denies any neck pain or stiffness, no fever, no numbness or weakness to the arms or legs.  With the headache she is now noticing worsening of her chronic back pain and whole body pain.  No bowel or bladder dysfunction.  Pain in the head is diffuse, constant, moderate in severity.  Worse with bright lights, worse with eye movements.  Review of Systems  A complete 10 system review of systems was obtained and all systems are negative except as noted in the HPI and PMH.   Patient's Health History    Past Medical History:  Diagnosis Date   Chronic back pain    DJD (degenerative joint disease)    Fibromyalgia    GERD (gastroesophageal reflux disease)    Migraine     Past Surgical History:  Procedure Laterality Date   abdominal laproscopy x 2     APPENDECTOMY     CESAREAN SECTION     x 3   TUBAL LIGATION      Family History  Problem Relation Age of Onset   Gout Mother    Diabetes Other     Social History   Socioeconomic History   Marital status: Single    Spouse name: Not on file   Number of children: Not on file   Years of education: Not on file   Highest education level: Not on file  Occupational History   Not on file  Tobacco Use   Smoking status: Current Every Day Smoker    Packs/day: 1.00    Types: Cigarettes   Smokeless tobacco: Never Used  Vaping Use   Vaping Use: Never used  Substance and Sexual Activity   Alcohol use: Yes     Comment: Very Rare   Drug use: Never   Sexual activity: Not on file  Other Topics Concern   Not on file  Social History Narrative   Not on file   Social Determinants of Health   Financial Resource Strain: Not on file  Food Insecurity: Not on file  Transportation Needs: Not on file  Physical Activity: Not on file  Stress: Not on file  Social Connections: Not on file  Intimate Partner Violence: Not on file     Physical Exam   Vitals:   07/15/20 0626 07/15/20 0728  BP: (!) 144/96 (!) 154/112  Pulse: 94 89  Resp: 16 16  Temp:  98.1 F (36.7 C)  SpO2: 98% 95%    CONSTITUTIONAL: Chronically ill-appearing, NAD NEURO:  Alert and oriented x 3, normal and symmetric strength and sensation, normal coordination, normal speech EYES:  eyes equal and reactive ENT/NECK:  no LAD, no JVD CARDIO: Regular rate, well-perfused, normal S1 and S2 PULM:  CTAB no wheezing or rhonchi GI/GU:  normal bowel sounds, non-distended, non-tender MSK/SPINE:  No gross deformities, no edema SKIN:  no rash, atraumatic PSYCH:  Appropriate speech and behavior  *Additional and/or pertinent findings included in MDM below  Diagnostic and Interventional  Summary    EKG Interpretation  Date/Time:    Ventricular Rate:    PR Interval:    QRS Duration:   QT Interval:    QTC Calculation:   R Axis:     Text Interpretation:        Labs Reviewed - No data to display  No orders to display    Medications  haloperidol lactate (HALDOL) injection 5 mg (5 mg Intramuscular Given 07/15/20 0723)  ketorolac (TORADOL) 30 MG/ML injection 30 mg (30 mg Intramuscular Given 07/15/20 5102)     Procedures  /  Critical Care Procedures  ED Course and Medical Decision Making  I have reviewed the triage vital signs, the nursing notes, and pertinent available records from the EMR.  Listed above are laboratory and imaging tests that I personally ordered, reviewed, and interpreted and then considered in my medical  decision making (see below for details).  Suspect uncomplicated migraine with flare of chronic pain, will attempt migraine cocktail and reassess.  Reassuring neurological exam, normal vital signs.     Headache resolved after medications listed above, appropriate for discharge.  Elmer Sow. Pilar Plate, MD [facility] Emergency Medicine [facility] [email or web address]  Final Clinical Impressions(s) / ED Diagnoses     ICD-10-CM   1. Acute nonintractable headache, unspecified headache type  R51.9   2. Chronic low back pain, unspecified back pain laterality, unspecified whether sciatica present  M54.50    G89.29     ED Discharge Orders    None       Discharge Instructions Discussed with and Provided to Patient:     Discharge Instructions     You were evaluated in the Emergency Department and after careful evaluation, we did not find any emergent condition requiring admission or further testing in the hospital.  Your exam/testing today was overall reassuring.  Symptoms seem to be due to a migraine.  Recommend follow up with PCP to discuss symptoms.  Please return to the Emergency Department if you experience any worsening of your condition.  Thank you for allowing [place] to be a part of your care.        Sabas Sous, MD 07/15/20 1524

## 2020-07-15 NOTE — ED Triage Notes (Signed)
Pt states she started getting a headache after going to the eye doctor on Monday  Pt states she has taken medications at home without relief  Pt has nausea and feels like something is in her throat  Pt states she has not been able to take her regular medications

## 2020-07-15 NOTE — Discharge Instructions (Addendum)
You were evaluated in the Emergency Department and after careful evaluation, we did not find any emergent condition requiring admission or further testing in the hospital.  Your exam/testing today was overall reassuring.  Symptoms seem to be due to a migraine.  Recommend follow up with PCP to discuss symptoms.  Please return to the Emergency Department if you experience any worsening of your condition.  Thank you for allowing [place] to be a part of your care.

## 2020-07-18 DIAGNOSIS — U071 COVID-19: Secondary | ICD-10-CM

## 2020-07-18 HISTORY — DX: COVID-19: U07.1

## 2020-08-07 ENCOUNTER — Emergency Department (HOSPITAL_BASED_OUTPATIENT_CLINIC_OR_DEPARTMENT_OTHER)
Admission: EM | Admit: 2020-08-07 | Discharge: 2020-08-07 | Disposition: A | Discharge status: Home / Self Care | Payer: Medicaid Other | Attending: Emergency Medicine | Admitting: Emergency Medicine

## 2020-08-07 ENCOUNTER — Other Ambulatory Visit: Payer: Self-pay

## 2020-08-07 ENCOUNTER — Encounter (HOSPITAL_BASED_OUTPATIENT_CLINIC_OR_DEPARTMENT_OTHER): Payer: Self-pay

## 2020-08-07 DIAGNOSIS — F17210 Nicotine dependence, cigarettes, uncomplicated: Secondary | ICD-10-CM | POA: Insufficient documentation

## 2020-08-07 DIAGNOSIS — W000XXA Fall on same level due to ice and snow, initial encounter: Secondary | ICD-10-CM | POA: Diagnosis not present

## 2020-08-07 DIAGNOSIS — S39012A Strain of muscle, fascia and tendon of lower back, initial encounter: Secondary | ICD-10-CM | POA: Insufficient documentation

## 2020-08-07 DIAGNOSIS — S3992XA Unspecified injury of lower back, initial encounter: Secondary | ICD-10-CM | POA: Diagnosis present

## 2020-08-07 DIAGNOSIS — T887XXA Unspecified adverse effect of drug or medicament, initial encounter: Secondary | ICD-10-CM

## 2020-08-07 DIAGNOSIS — R1110 Vomiting, unspecified: Secondary | ICD-10-CM

## 2020-08-07 DIAGNOSIS — R112 Nausea with vomiting, unspecified: Secondary | ICD-10-CM | POA: Insufficient documentation

## 2020-08-07 DIAGNOSIS — T43025A Adverse effect of tetracyclic antidepressants, initial encounter: Secondary | ICD-10-CM | POA: Insufficient documentation

## 2020-08-07 MED ORDER — ONDANSETRON 4 MG PO TBDP
4.0000 mg | ORAL_TABLET | Freq: Once | ORAL | Status: AC
  Administered 2020-08-07: 4 mg via ORAL
  Filled 2020-08-07: qty 1

## 2020-08-07 MED ORDER — KETOROLAC TROMETHAMINE 60 MG/2ML IM SOLN
60.0000 mg | Freq: Once | INTRAMUSCULAR | Status: AC
  Administered 2020-08-07: 60 mg via INTRAMUSCULAR
  Filled 2020-08-07: qty 2

## 2020-08-07 MED ORDER — ACETAMINOPHEN 500 MG PO TABS
1000.0000 mg | ORAL_TABLET | Freq: Once | ORAL | Status: AC
  Administered 2020-08-07: 1000 mg via ORAL
  Filled 2020-08-07: qty 2

## 2020-08-07 MED ORDER — ONDANSETRON 4 MG PO TBDP: ORAL_TABLET | ORAL | 0 refills | Status: DC

## 2020-08-07 NOTE — ED Triage Notes (Signed)
Pt slipped on ice Monday, saw Neurology, was started on delayed release cymbalta. States made her nauseous & vomit several times. Pt states wasn't able to take pain meds d/t nausea/vomiting. Having back pain & right hip/leg pain, was unable to take pain meds.   Here today d/t pain. States had xrays after fall with no fractures.

## 2020-08-07 NOTE — Discharge Instructions (Signed)
Follow-up with your doctor who prescribed your recent medication.  Hold medication at this time. Use Zofran as needed for nausea and vomiting.  Use Tylenol and Motrin as needed for pain along with ice.

## 2020-08-07 NOTE — ED Provider Notes (Signed)
[facility] EMERGENCY DEPARTMENT Provider Note   CSN: [id number] Arrival date & time: 08/07/20  0850     History Chief Complaint  Patient presents with   Back Pain    After Marletta Lor    Beth Porter is a 45 y.o. female.  Patient with history of chronic pain, fibromyalgia presents with lower back pain and vomiting.  Patient had fall on Monday in the ice and was evaluated had x-rays done which per her report were negative.  Patient denies any weakness numbness or neurologic signs or symptoms.  Patient took first dose of Cymbalta and since then has been nausea and vomiting several times nonbloody.  No new falls.  No abdominal pain.        Past Medical History:  Diagnosis Date   Chronic back pain    DJD (degenerative joint disease)    Fibromyalgia    GERD (gastroesophageal reflux disease)    Migraine     There are no problems to display for this patient.   Past Surgical History:  Procedure Laterality Date   abdominal laproscopy x 2     APPENDECTOMY     CESAREAN SECTION     x 3   TUBAL LIGATION       OB History   No obstetric history on file.     Family History  Problem Relation Age of Onset   Gout Mother    Diabetes Other     Social History   Tobacco Use   Smoking status: Current Every Day Smoker    Packs/day: 1.00    Types: Cigarettes   Smokeless tobacco: Never Used  Vaping Use   Vaping Use: Never used  Substance Use Topics   Alcohol use: Yes    Comment: Very Rare   Drug use: Never    Home Medications Prior to Admission medications   Medication Sig Start Date End Date Taking? Authorizing Provider  ondansetron (ZOFRAN ODT) 4 MG disintegrating tablet 4mg  ODT q4 hours prn nausea/vomit 08/07/20  Yes 08/09/20, MD  HYDROcodone-acetaminophen (NORCO) 10-325 MG tablet Take 1 tablet by mouth every 2 (two) hours as needed for moderate pain.    [provider]  ibuprofen (ADVIL,MOTRIN) 600 MG tablet Take 1 tablet (600  mg total) by mouth every 8 (eight) hours as needed. Take with food. 04/09/18   04/11/18, MD  ondansetron (ZOFRAN ODT) 4 MG disintegrating tablet Take 1 tablet (4 mg total) by mouth every 8 (eight) hours as needed for nausea or vomiting. 05/15/20   05/17/20, MD  pregabalin (LYRICA) 50 MG capsule Take 225 mg by mouth 2 (two) times daily.     [provider]  promethazine (PHENERGAN) 25 MG tablet Take 1 tablet (25 mg total) by mouth every 6 (six) hours as needed for nausea or vomiting. 05/15/20   05/17/20, MD    Allergies    Aspirin and Metronidazole  Review of Systems   Review of Systems  Constitutional: Negative for chills and fever.  HENT: Negative for congestion.   Eyes: Negative for visual disturbance.  Respiratory: Negative for shortness of breath.   Cardiovascular: Negative for chest pain.  Gastrointestinal: Positive for nausea and vomiting. Negative for abdominal pain.  Genitourinary: Negative for dysuria and flank pain.  Musculoskeletal: Positive for back pain. Negative for neck pain and neck stiffness.  Skin: Negative for rash.  Neurological: Negative for light-headedness and headaches.    Physical Exam Updated Vital Signs BP (!) 159/91 (BP  Location: Right Arm)    Pulse 98    Temp 97.8 F (36.6 C) (Oral)    Resp 20    Ht 4\' 10"  (1.473 m)    Wt 74.8 kg    SpO2 98%    BMI 34.49 kg/m   Physical Exam Vitals and nursing note reviewed.  Constitutional:      Appearance: She is well-developed and well-nourished.  HENT:     Head: Normocephalic and atraumatic.  Eyes:     General:        Right eye: No discharge.        Left eye: No discharge.     Conjunctiva/sclera: Conjunctivae normal.  Neck:     Trachea: No tracheal deviation.  Cardiovascular:     Rate and Rhythm: Normal rate.  Pulmonary:     Effort: Pulmonary effort is normal.  Abdominal:     General: There is no distension.     Palpations: Abdomen is soft.     Tenderness: There is no  abdominal tenderness. There is no guarding.  Musculoskeletal:        General: Tenderness present. No swelling or edema.     Cervical back: Normal range of motion and neck supple.     Comments: Patient has paraspinal tenderness lumbar region, no step-off or significant midline tenderness.  Pain with range of motion.  Patient has normal strength 5+ lower extremities with flexion extension major joints.  Sensation intact palpation distal legs.  Skin:    General: Skin is warm.     Findings: No rash.  Neurological:     Mental Status: She is alert and oriented to person, place, and time.  Psychiatric:        Mood and Affect: Mood and affect normal.     ED Results / Procedures / Treatments   Labs (all labs ordered are listed, but only abnormal results are displayed) Labs Reviewed - No data to display  EKG None  Radiology No results found.  Procedures Procedures (including critical care time)  Medications Ordered in ED Medications  ondansetron (ZOFRAN-ODT) disintegrating tablet 4 mg (4 mg Oral Given 08/07/20 1009)  acetaminophen (TYLENOL) tablet 1,000 mg (1,000 mg Oral Given 08/07/20 1009)  ketorolac (TORADOL) injection 60 mg (60 mg Intramuscular Given 08/07/20 1010)    ED Course  I have reviewed the triage vital signs and the nursing notes.  Pertinent labs & imaging results that were available during my care of the patient were reviewed by me and considered in my medical decision making (see chart for details).    MDM Rules/Calculators/A&P                          Patient presents with nausea and vomiting since taking Cymbalta.  Discussed patient can hold this medication as she is only taken 1 dose and is not absolutely needed at this time and to follow-up with her primary doctor for this.  Zofran given as needed.  Patient also has acute on chronic pain, Toradol and Tylenol given in the ER.  Supportive care discussed and work note given.  Reviewed x-rays done through [facility] no fractures of lumbar spine.  No indication for additional imaging at this time.  Final Clinical Impression(s) / ED Diagnoses Final diagnoses:  Acute myofascial strain of lumbar region, initial encounter  Medication side effect  Vomiting in adult    Rx / DC Orders ED Discharge Orders  Ordered    ondansetron (ZOFRAN ODT) 4 MG disintegrating tablet        08/07/20 1021           Blane Ohara, MD 08/07/20 1024

## 2020-08-08 ENCOUNTER — Other Ambulatory Visit: Payer: Self-pay

## 2020-08-08 ENCOUNTER — Emergency Department (HOSPITAL_BASED_OUTPATIENT_CLINIC_OR_DEPARTMENT_OTHER)
Admission: EM | Admit: 2020-08-08 | Discharge: 2020-08-08 | Disposition: A | Discharge status: Home / Self Care | Payer: Medicaid Other | Attending: Emergency Medicine | Admitting: Emergency Medicine

## 2020-08-08 ENCOUNTER — Encounter (HOSPITAL_BASED_OUTPATIENT_CLINIC_OR_DEPARTMENT_OTHER): Payer: Self-pay | Admitting: *Deleted

## 2020-08-08 DIAGNOSIS — R519 Headache, unspecified: Secondary | ICD-10-CM | POA: Insufficient documentation

## 2020-08-08 DIAGNOSIS — F17210 Nicotine dependence, cigarettes, uncomplicated: Secondary | ICD-10-CM | POA: Insufficient documentation

## 2020-08-08 DIAGNOSIS — R112 Nausea with vomiting, unspecified: Secondary | ICD-10-CM | POA: Diagnosis not present

## 2020-08-08 LAB — BASIC METABOLIC PANEL
Anion gap: 12 (ref 5–15)
BUN: 9 mg/dL (ref 6–20)
CO2: 24 mmol/L (ref 22–32)
Calcium: 9.1 mg/dL (ref 8.9–10.3)
Chloride: 101 mmol/L (ref 98–111)
Creatinine, Ser: 0.75 mg/dL (ref 0.44–1.00)
GFR, Estimated: >60 mL/min (ref >60)
Glucose, Bld: 102 mg/dL — ABNORMAL HIGH (ref 70–99)
Potassium: 3.9 mmol/L (ref 3.5–5.1)
Sodium: 137 mmol/L (ref 135–145)

## 2020-08-08 LAB — CBC WITH DIFFERENTIAL/PLATELET
Abs Immature Granulocytes: 0.07 10*3/uL (ref 0.00–0.07)
Basophils Absolute: 0 10*3/uL (ref 0.0–0.1)
Basophils Relative: 0 %
Eosinophils Absolute: 0.1 10*3/uL (ref 0.0–0.5)
Eosinophils Relative: 1 %
HCT: 45.2 % (ref 36.0–46.0)
Hemoglobin: 14.5 g/dL (ref 12.0–15.0)
Immature Granulocytes: 1 %
Lymphocytes Relative: 33 %
Lymphs Abs: 4.1 10*3/uL — ABNORMAL HIGH (ref 0.7–4.0)
MCH: 25.5 pg — ABNORMAL LOW (ref 26.0–34.0)
MCHC: 32.1 g/dL (ref 30.0–36.0)
MCV: 79.6 fL — ABNORMAL LOW (ref 80.0–100.0)
Monocytes Absolute: 0.5 10*3/uL (ref 0.1–1.0)
Monocytes Relative: 4 %
Neutro Abs: 7.7 10*3/uL (ref 1.7–7.7)
Neutrophils Relative %: 61 %
Platelets: 470 10*3/uL — ABNORMAL HIGH (ref 150–400)
RBC: 5.68 MIL/uL — ABNORMAL HIGH (ref 3.87–5.11)
RDW: 15.3 % (ref 11.5–15.5)
WBC: 12.5 10*3/uL — ABNORMAL HIGH (ref 4.0–10.5)
nRBC: 0 % (ref 0.0–0.2)

## 2020-08-08 MED ORDER — METOCLOPRAMIDE HCL 5 MG/ML IJ SOLN
10.0000 mg | Freq: Once | INTRAMUSCULAR | Status: AC
  Administered 2020-08-08: 10 mg via INTRAVENOUS
  Filled 2020-08-08: qty 2

## 2020-08-08 MED ORDER — DEXAMETHASONE SODIUM PHOSPHATE 4 MG/ML IJ SOLN
4.0000 mg | Freq: Once | INTRAMUSCULAR | Status: AC
  Administered 2020-08-08: 4 mg via INTRAVENOUS
  Filled 2020-08-08: qty 1

## 2020-08-08 MED ORDER — SODIUM CHLORIDE 0.9 % IV BOLUS
1000.0000 mL | Freq: Once | INTRAVENOUS | Status: AC
  Administered 2020-08-08: 1000 mL via INTRAVENOUS

## 2020-08-08 MED ORDER — PROMETHAZINE HCL 25 MG PO TABS: 25.0000 mg | ORAL_TABLET | Freq: Four times a day (QID) | ORAL | 0 refills | Status: AC | PRN

## 2020-08-08 MED ORDER — DIPHENHYDRAMINE HCL 25 MG PO CAPS
25.0000 mg | ORAL_CAPSULE | Freq: Once | ORAL | Status: AC
  Administered 2020-08-08: 25 mg via ORAL
  Filled 2020-08-08: qty 1

## 2020-08-08 NOTE — ED Notes (Signed)
Pt ambulatory with steady gait to restroom. Urine specimen device provided

## 2020-08-08 NOTE — ED Provider Notes (Signed)
[facility] EMERGENCY DEPARTMENT Provider Note   CSN: [id number] Arrival date & time: 08/08/20  1354     History Chief Complaint  Patient presents with  . Headache    Beth Porter is a 45 y.o. female.  HPI Patient is a 45 year old female with a history of chronic back pain, fibromyalgia, degenerative disc disease and joint disease, migraines and reflux presented today with complaints of nausea and vomiting since Tuesday when she was started on Cymbalta.  She states that she was vomiting 4-5 times per day for couple days states that she is for the past few days and only had 1 or 2 episodes of vomiting per day.  She states that she has had difficulty keeping down any of her medications including her Lyrica and now feels achy all over which she states is consistent with her fibromyalgia when she does not take her medications.  She denies any abdominal pain chest pain or shortness of breath.  She states her headache is bandlike and seems to scan the entire frontal part of her head.  She denies any vision changes.  She has had decreased p.o. intake because of decreased appetite she states she has not had any significant abdominal pain with eating or any other issues feeding besides nausea.  She states that she was tested for COVID 2 days ago was negative.  She states she is vaccinated and states that she would not like to be tested again today.  She denies any fevers or chills, lightheadedness or dizziness.    Past Medical History:  Diagnosis Date  . Chronic back pain   . DJD (degenerative joint disease)   . Fibromyalgia   . GERD (gastroesophageal reflux disease)   . Migraine     There are no problems to display for this patient.   Past Surgical History:  Procedure Laterality Date  . abdominal laproscopy x 2    . APPENDECTOMY    . CESAREAN SECTION     x 3  . TUBAL LIGATION       OB History   No obstetric history on file.     Family History  Problem Relation  Age of Onset  . Gout Mother   . Diabetes Other     Social History   Tobacco Use  . Smoking status: Current Every Day Smoker    Packs/day: 1.00    Types: Cigarettes  . Smokeless tobacco: Never Used  Vaping Use  . Vaping Use: Never used  Substance Use Topics  . Alcohol use: Yes    Comment: Very Rare  . Drug use: Never    Home Medications Prior to Admission medications   Medication Sig Start Date End Date Taking? Authorizing Provider  HYDROcodone-acetaminophen (NORCO) 10-325 MG tablet Take 1 tablet by mouth every 2 (two) hours as needed for moderate pain.    [provider]  ibuprofen (ADVIL,MOTRIN) 600 MG tablet Take 1 tablet (600 mg total) by mouth every 8 (eight) hours as needed. Take with food. 04/09/18   Cathren Laine, MD  ondansetron (ZOFRAN ODT) 4 MG disintegrating tablet Take 1 tablet (4 mg total) by mouth every 8 (eight) hours as needed for nausea or vomiting. 05/15/20   Sabas Sous, MD  ondansetron (ZOFRAN ODT) 4 MG disintegrating tablet 4mg  ODT q4 hours prn nausea/vomit 08/07/20   08/09/20, MD  pregabalin (LYRICA) 50 MG capsule Take 225 mg by mouth 2 (two) times daily.     [provider]  promethazine (  PHENERGAN) 25 MG tablet Take 1 tablet (25 mg total) by mouth every 6 (six) hours as needed for nausea or vomiting. 05/15/20   Sabas Sous, MD    Allergies    Aspirin and Metronidazole  Review of Systems   Review of Systems  Constitutional: Negative for chills and fever.  HENT: Negative for congestion.   Eyes: Negative for pain.  Respiratory: Negative for cough and shortness of breath.   Cardiovascular: Negative for chest pain and leg swelling.  Gastrointestinal: Positive for nausea and vomiting. Negative for abdominal pain and diarrhea.  Genitourinary: Negative for dysuria.  Musculoskeletal: Positive for myalgias.  Skin: Negative for rash.  Neurological: Positive for headaches. Negative for dizziness.    Physical Exam Updated Vital  Signs BP (!) 150/95 (BP Location: Right Arm)   Pulse 87   Temp 98 F (36.7 C) (Oral)   Resp 16   Ht 4\' 10"  (1.473 m)   Wt 74.4 kg   SpO2 100%   BMI 34.28 kg/m   Physical Exam Vitals and nursing note reviewed.  Constitutional:      General: She is not in acute distress.    Appearance: She is obese.     Comments: Pleasant well-appearing obese 45 year old female appears older than stated age  HENT:     Head: Normocephalic and atraumatic.     Nose: Nose normal.     Mouth/Throat:     Mouth: Mucous membranes are dry.  Eyes:     General: No scleral icterus. Cardiovascular:     Rate and Rhythm: Normal rate and regular rhythm.     Pulses: Normal pulses.     Heart sounds: Normal heart sounds.  Pulmonary:     Effort: Pulmonary effort is normal. No respiratory distress.     Breath sounds: No wheezing.  Abdominal:     Palpations: Abdomen is soft.     Tenderness: There is no abdominal tenderness. There is no guarding or rebound.     Comments: Obese abdomen soft nontender.  No guarding or rebound.  Musculoskeletal:     Cervical back: Normal range of motion.     Right lower leg: No edema.     Left lower leg: No edema.  Skin:    General: Skin is warm and dry.     Capillary Refill: Capillary refill takes less than 2 seconds.  Neurological:     Mental Status: She is alert. Mental status is at baseline.     Comments: Alert and oriented to self, place, time and event.   Speech is fluent, clear without dysarthria or dysphasia.   Strength 5/5 in upper/lower extremities  Sensation intact in upper/lower extremities   CN I not tested  CN II grossly intact visual fields bilaterally. Did not visualize posterior eye.   CN III, IV, VI PERRLA and EOMs intact bilaterally  CN V Intact sensation to sharp and light touch to the face  CN VII facial movements symmetric  CN VIII not tested  CN IX, X no uvula deviation, symmetric rise of soft palate  CN XI 5/5 SCM and trapezius strength  bilaterally  CN XII Midline tongue protrusion, symmetric L/R movements    Psychiatric:        Mood and Affect: Mood normal.        Behavior: Behavior normal.     ED Results / Procedures / Treatments   Labs (all labs ordered are listed, but only abnormal results are displayed) Labs Reviewed  CBC WITH DIFFERENTIAL/PLATELET -  Abnormal; Notable for the following components:      Result Value   WBC 12.5 (*)    RBC 5.68 (*)    MCV 79.6 (*)    MCH 25.5 (*)    Platelets 470 (*)    Lymphs Abs 4.1 (*)    All other components within normal limits  BASIC METABOLIC PANEL - Abnormal; Notable for the following components:   Glucose, Bld 102 (*)    All other components within normal limits    EKG None  Radiology No results found.  Procedures Procedures (including critical care time)  Medications Ordered in ED Medications  sodium chloride 0.9 % bolus 1,000 mL (1,000 mLs Intravenous New Bag/Given 08/08/20 1623)  metoCLOPramide (REGLAN) injection 10 mg (10 mg Intravenous Given 08/08/20 1639)  diphenhydrAMINE (BENADRYL) capsule 25 mg (25 mg Oral Given 08/08/20 1607)  dexamethasone (DECADRON) injection 4 mg (4 mg Intravenous Given 08/08/20 1642)    ED Course  I have reviewed the triage vital signs and the nursing notes.  Pertinent labs & imaging results that were available during my care of the patient were reviewed by me and considered in my medical decision making (see chart for details).  Patient is 45 year old female with history of chronic pain and uncomplicated migraine.  She has had some nausea and vomiting intermittently.  We will check electrolytes and provide with migraine cocktail and reevaluate.  Physical exam is unremarkable. Neurologic exam is unremarkable.  Normal vital signs apart from mild hypertension.  Emergent considerations for headache include subarachnoid hemorrhage, meningitis, temporal arteritis, glaucoma, cerebral ischemia, carotid/vertebral dissection,  intracranial tumor, Venous sinus thrombosis, carbon monoxide poisoning, acute or chronic subdural hemorrhage.  Other considerations include: Migraine, Cluster headache, Hypertension, Caffeine, alcohol, or drug withdrawal, Pseudotumor cerebri, Arteriovenous malformation, Head injury, Neurocysticercosis, Post-lumbar puncture, Preeclampsia, Tension headache, Sinusitis, Cervical arthritis, Refractive error causing strain, Dental abscess, Otitis media, Temporomandibular joint syndrome, Depression, Somatoform disorder (eg, somatization) Trigeminal neuralgia, Glossopharyngeal neuralgia.  I have very low suspicion for emergent cause of headache today.  Suspect that this is her chronic intermittent headaches that she suffers from.  She was seen 12/29 for similar.  Headache significantly proved after medications.  Clinical Course as of 08/08/20 1722  Sat Aug 08, 2020  1651 WBC(!): 12.5 Mild leukocytosis appears to be chronic over the past several years.  RBC count elevated as well consistent with dehydration.  Platelets also increased. [WF]  1700 Basic metabolic panelTheda Sers [WF]    Clinical Course User Index [WF] Gailen Shelter, [place]   Discharged with recommendations for Tylenol and ibuprofen and Phenergan given for nausea.  MDM Rules/Calculators/A&P                          Final Clinical Impression(s) / ED Diagnoses Final diagnoses:  Bad headache  Non-intractable vomiting with nausea, unspecified vomiting type    Rx / DC Orders ED Discharge Orders    None       Gailen Shelter, [place] 08/08/20 1730    Terald Sleeper, MD 08/09/20 [id number]

## 2020-08-08 NOTE — Discharge Instructions (Signed)
Please take your medications as prescribed.  Please use the Phenergan as needed for nausea.  Please drink plenty of water and eat plenty of food.  It is vitally important follow-up with your primary care doctor.  You may return to the ER for any new or concerning symptoms.

## 2020-08-08 NOTE — ED Notes (Signed)
Water provided for po challenge 

## 2020-08-08 NOTE — ED Triage Notes (Signed)
Was recently started on Cymbalta, since starting she feels very nauseated, hard to take in PO solids, so PO intake poor, unable to take regular medicine. "I feel just sick", body aches, has hx of fibromyalgia.

## 2020-08-08 NOTE — ED Notes (Signed)
Gait very steady, but does ambulate very slowly due to back pain, required assistance to get out of chair. Positioned on stretcher for comfort, safety measures in place, sr x 2 up, call bell within reach.

## 2020-10-04 ENCOUNTER — Emergency Department (HOSPITAL_BASED_OUTPATIENT_CLINIC_OR_DEPARTMENT_OTHER): Payer: Medicaid Other

## 2020-10-04 ENCOUNTER — Emergency Department (HOSPITAL_BASED_OUTPATIENT_CLINIC_OR_DEPARTMENT_OTHER)
Admission: EM | Admit: 2020-10-04 | Discharge: 2020-10-04 | Disposition: A | Discharge status: Home / Self Care | Payer: Medicaid Other | Attending: Emergency Medicine | Admitting: Emergency Medicine

## 2020-10-04 ENCOUNTER — Encounter (HOSPITAL_BASED_OUTPATIENT_CLINIC_OR_DEPARTMENT_OTHER): Payer: Self-pay | Admitting: Emergency Medicine

## 2020-10-04 ENCOUNTER — Other Ambulatory Visit: Payer: Self-pay

## 2020-10-04 DIAGNOSIS — S299XXA Unspecified injury of thorax, initial encounter: Secondary | ICD-10-CM | POA: Diagnosis present

## 2020-10-04 DIAGNOSIS — F17210 Nicotine dependence, cigarettes, uncomplicated: Secondary | ICD-10-CM | POA: Insufficient documentation

## 2020-10-04 DIAGNOSIS — S29011A Strain of muscle and tendon of front wall of thorax, initial encounter: Secondary | ICD-10-CM | POA: Diagnosis not present

## 2020-10-04 DIAGNOSIS — T148XXA Other injury of unspecified body region, initial encounter: Secondary | ICD-10-CM

## 2020-10-04 DIAGNOSIS — Y990 Civilian activity done for income or pay: Secondary | ICD-10-CM | POA: Diagnosis not present

## 2020-10-04 DIAGNOSIS — X500XXA Overexertion from strenuous movement or load, initial encounter: Secondary | ICD-10-CM | POA: Insufficient documentation

## 2020-10-04 DIAGNOSIS — R0789 Other chest pain: Secondary | ICD-10-CM

## 2020-10-04 MED ORDER — CYCLOBENZAPRINE HCL 10 MG PO TABS: 10.0000 mg | ORAL_TABLET | Freq: Two times a day (BID) | ORAL | 0 refills | Status: DC | PRN

## 2020-10-04 MED ORDER — KETOROLAC TROMETHAMINE 60 MG/2ML IM SOLN
60.0000 mg | Freq: Once | INTRAMUSCULAR | Status: AC
  Administered 2020-10-04: 60 mg via INTRAMUSCULAR
  Filled 2020-10-04: qty 2

## 2020-10-04 MED ORDER — LIDOCAINE 5 % EX PTCH: 1.0000 | MEDICATED_PATCH | CUTANEOUS | 0 refills | Status: DC

## 2020-10-04 MED ORDER — CYCLOBENZAPRINE HCL 10 MG PO TABS
10.0000 mg | ORAL_TABLET | Freq: Once | ORAL | Status: AC
  Administered 2020-10-04: 10 mg via ORAL
  Filled 2020-10-04: qty 1

## 2020-10-04 NOTE — ED Triage Notes (Signed)
Pt arrives pov with c/o left flank pain and pain under left arm. Pt endorses carrying heavy trays for work.

## 2020-10-04 NOTE — ED Provider Notes (Signed)
[facility] EMERGENCY DEPARTMENT Provider Note   CSN: [id number] Arrival date & time: 10/04/20  0725     History Chief Complaint  Patient presents with  . Flank Pain    Beth Porter is a 45 y.o. female.  HPI      45 year old female with history of chronic back pain, fibromyalgia, migraine, GERD presents with concern for pain of the left side.   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  Hurts left lateral chest under arm, does not have other chest pain, small movements make it worse, arm movements make it worse. Thought it would get better and it has not.  No specific injury.  Has been a little fatigued.  Has been busier at work.  No dyspnea, urinary symptoms, nausea, vomiting, abdominal pain.  Has some chronic unchanged reflux.  7/10 pain.  Might be more. Took regular rx, 10mg  hydrocodone and lyrica, but only supposed to take them twice a day  Smoking, rare etoh, drug use  No hx of DVT/PE, recent surgeries, long trips, asymmetric leg pain or swelling  Past Medical History:  Diagnosis Date  . Chronic back pain   . DJD (degenerative joint disease)   . Fibromyalgia   . GERD (gastroesophageal reflux disease)   . Migraine     There are no problems to display for this patient.   Past Surgical History:  Procedure Laterality Date  . abdominal laproscopy x 2    . APPENDECTOMY    . CESAREAN SECTION     x 3  . TUBAL LIGATION       OB History   No obstetric history on file.     Family History  Problem Relation Age of Onset  . Gout Mother   . Diabetes Other     Social History   Tobacco Use  . Smoking status: Current Every Day Smoker    Packs/day: 1.00    Types: Cigarettes  . Smokeless tobacco: Never Used  Vaping Use  . Vaping Use: Never used  Substance Use Topics  . Alcohol use: Yes    Comment:  Very Rare  . Drug use: Never    Home Medications Prior to Admission medications   Medication Sig Start Date End Date Taking? Authorizing Provider  cyclobenzaprine (FLEXERIL) 10 MG tablet Take 1 tablet (10 mg total) by mouth 2 (two) times daily as needed for muscle spasms. 10/04/20  Yes 10/06/20, MD  lidocaine (LIDODERM) 5 % Place 1 patch onto the skin daily. Remove & Discard patch within 12 hours or as directed by MD 10/04/20  Yes 10/06/20, MD  HYDROcodone-acetaminophen (NORCO) 10-325 MG tablet Take 1 tablet by mouth every 2 (two) hours as needed for moderate pain.    [provider]  ibuprofen (ADVIL,MOTRIN) 600 MG tablet Take 1 tablet (600 mg total) by mouth every 8 (eight) hours as needed. Take with food. 04/09/18   04/11/18, MD  ondansetron (ZOFRAN ODT) 4 MG disintegrating tablet Take 1 tablet (4 mg total) by mouth every 8 (eight) hours as needed for nausea or vomiting. 05/15/20   05/17/20, MD  ondansetron (ZOFRAN ODT) 4 MG disintegrating tablet 4mg  ODT q4 hours prn nausea/vomit 08/07/20   , MD  pregabalin (LYRICA) 50 MG capsule Take 225 mg by mouth 2 (two) times daily.  [provider]  promethazine (PHENERGAN) 25 MG tablet Take 1 tablet (25 mg total) by mouth every 6 (six) hours as needed for nausea or vomiting. 08/08/20   Gailen Shelter, PA    Allergies    Aspirin and Metronidazole  Review of Systems   Review of Systems  Constitutional: Negative for fever.  Eyes: Negative for visual disturbance.  Respiratory: Negative for cough and shortness of breath.   Cardiovascular: Positive for chest pain (left chest wall pain).  Gastrointestinal: Negative for abdominal pain, nausea and vomiting.  Genitourinary: Positive for flank pain. Negative for difficulty urinating.  Musculoskeletal: Positive for back pain. Negative for neck pain.  Skin: Negative for rash.  Neurological: Negative for syncope.    Physical Exam Updated Vital  Signs BP (!) 146/83   Pulse 93   Temp (!) 97.5 F (36.4 C) (Oral)   Resp (!) 21   Ht 4\' 10"  (1.473 m)   Wt 80.1 kg   LMP 09/27/2020   SpO2 98%   BMI 36.90 kg/m   Physical Exam Vitals and nursing note reviewed.  Constitutional:      General: She is not in acute distress.    Appearance: She is well-developed. She is not diaphoretic.  HENT:     Head: Normocephalic and atraumatic.  Eyes:     Conjunctiva/sclera: Conjunctivae normal.  Cardiovascular:     Rate and Rhythm: Normal rate and regular rhythm.     Heart sounds: Normal heart sounds. No murmur heard. No friction rub. No gallop.   Pulmonary:     Effort: Pulmonary effort is normal. No respiratory distress.     Breath sounds: Normal breath sounds. No wheezing or rales.  Chest:     Chest wall: Tenderness (left lateral chest extending to back, no rash) present.  Abdominal:     General: There is no distension.     Palpations: Abdomen is soft.     Tenderness: There is no abdominal tenderness. There is no guarding.  Musculoskeletal:        General: No tenderness.     Cervical back: Normal range of motion.  Skin:    General: Skin is warm and dry.     Findings: No erythema or rash.  Neurological:     Mental Status: She is alert and oriented to person, place, and time.     ED Results / Procedures / Treatments   Labs (all labs ordered are listed, but only abnormal results are displayed) Labs Reviewed - No data to display  EKG EKG Interpretation  Date/Time:  Sunday October 04 2020 08:29:52 EDT Ventricular Rate:  97 PR Interval:    QRS Duration: 86 QT Interval:  351 QTC Calculation: 446 R Axis:   73 Text Interpretation: Sinus rhythm RSR' in V1 or V2, right VCD or RVH No previous ECGs available Confirmed by [id number] (Alvira Monday) on 10/04/2020 8:50:55 AM   Radiology DG Chest 2 View  Result Date: 10/04/2020 CLINICAL DATA:  Left chest pain EXAM: CHEST - 2 VIEW COMPARISON:  August 13, 2013 FINDINGS: The heart size  and mediastinal contours are within normal limits. Both lungs are clear. The visualized skeletal structures are unremarkable. IMPRESSION: No active cardiopulmonary disease. Electronically Signed   By: August 15, 2013 M.D.   On: 10/04/2020 09:00    Procedures Procedures   Medications Ordered in ED Medications  ketorolac (TORADOL) injection 60 mg (60 mg Intramuscular Given 10/04/20 0825)  cyclobenzaprine (FLEXERIL) tablet 10 mg (10 mg Oral Given 10/04/20 0825)  ED Course  I have reviewed the triage vital signs and the nursing notes.  Pertinent labs & imaging results that were available during my care of the patient were reviewed by me and considered in my medical decision making (see chart for details).    MDM Rules/Calculators/A&P                          45 year old female with history of chronic back pain, fibromyalgia, migraine, GERD presents with concern for pain of the left side.  CXR shows no acute abnormalities, no sign of fx, pneumothorax, pneumonia.  EKG without acute findings. Low suspicion for ACS, PE, dissection by history and exam which are most consistent with muscular strain.  Given rx for flexeril, lidocaine patch and recommend rest and PCP follow up. Patient discharged in stable condition with understanding of reasons to return.    Final Clinical Impression(s) / ED Diagnoses Final diagnoses:  Left-sided chest wall pain  Muscle strain    Rx / DC Orders ED Discharge Orders         Ordered    cyclobenzaprine (FLEXERIL) 10 MG tablet  2 times daily PRN        10/04/20 0851    lidocaine (LIDODERM) 5 %  Every 24 hours        10/04/20 0851           Alvira Monday, MD 10/04/20 1135

## 2020-11-23 ENCOUNTER — Emergency Department (HOSPITAL_BASED_OUTPATIENT_CLINIC_OR_DEPARTMENT_OTHER)
Admission: EM | Admit: 2020-11-23 | Discharge: 2020-11-23 | Disposition: A | Discharge status: Home / Self Care | Payer: Medicaid Other | Attending: Emergency Medicine | Admitting: Emergency Medicine

## 2020-11-23 ENCOUNTER — Encounter (HOSPITAL_BASED_OUTPATIENT_CLINIC_OR_DEPARTMENT_OTHER): Payer: Self-pay

## 2020-11-23 ENCOUNTER — Emergency Department (HOSPITAL_BASED_OUTPATIENT_CLINIC_OR_DEPARTMENT_OTHER): Payer: Medicaid Other

## 2020-11-23 ENCOUNTER — Other Ambulatory Visit: Payer: Self-pay

## 2020-11-23 DIAGNOSIS — M25552 Pain in left hip: Secondary | ICD-10-CM | POA: Insufficient documentation

## 2020-11-23 DIAGNOSIS — W1809XA Striking against other object with subsequent fall, initial encounter: Secondary | ICD-10-CM | POA: Insufficient documentation

## 2020-11-23 DIAGNOSIS — F17210 Nicotine dependence, cigarettes, uncomplicated: Secondary | ICD-10-CM | POA: Insufficient documentation

## 2020-11-23 DIAGNOSIS — R202 Paresthesia of skin: Secondary | ICD-10-CM | POA: Insufficient documentation

## 2020-11-23 DIAGNOSIS — Y92000 Kitchen of unspecified non-institutional (private) residence as  the place of occurrence of the external cause: Secondary | ICD-10-CM | POA: Diagnosis not present

## 2020-11-23 MED ORDER — HYDROCODONE-ACETAMINOPHEN 5-325 MG PO TABS
2.0000 | ORAL_TABLET | Freq: Once | ORAL | Status: AC
  Administered 2020-11-23: 2 via ORAL
  Filled 2020-11-23: qty 2

## 2020-11-23 MED ORDER — KETOROLAC TROMETHAMINE 30 MG/ML IJ SOLN
30.0000 mg | Freq: Once | INTRAMUSCULAR | Status: AC
  Administered 2020-11-23: 30 mg via INTRAMUSCULAR
  Filled 2020-11-23: qty 1

## 2020-11-23 MED ORDER — CYCLOBENZAPRINE HCL 10 MG PO TABS: 10.0000 mg | ORAL_TABLET | Freq: Three times a day (TID) | ORAL | 0 refills | Status: DC | PRN

## 2020-11-23 NOTE — ED Notes (Signed)
Patient transported to X-ray 

## 2020-11-23 NOTE — Discharge Instructions (Signed)
We believe that your symptoms are caused by musculoskeletal strain.  Please read through the included information about additional care such as heating pads, over-the-counter pain medicine.  If you were provided a prescription please use it only as needed and as instructed.  Remember that early mobility and using the affected part of your body is actually better than keeping it immobile.  I have prescribed Flexeril. Do not take this at the same time as your other pain medications as this can make you drowsy. Do not drive a car if taking Flexeril.   Follow-up with the doctor listed as recommended or return to the emergency department with new or worsening symptoms that concern you.

## 2020-11-23 NOTE — ED Provider Notes (Signed)
Emergency Department Provider Note   I have reviewed the triage vital signs and the nursing notes.   HISTORY  Chief Complaint Hip Pain (Patient states she stepped down on the left foot and experienced pain in her hip.)   HPI Beth Porter is a 45 y.o. female with PMH reviewed below including fibromyalgia presents to the ED with left hip pain since yesterday. She was in her kitchen when she stepped down hard on the left foot she felt a sudden, shooting pain up the leg into the left hip. She fell against the wall also striking the left hip on the wall. She did not fall and hit her head. She has been limping with pain since and with difficulty resting came to the ED. Denies any numbness/weakness in the foot. No worsening pain in the lower back. She has occasional tingling in the toes which is unchanged with her sciatica pain. No dysuria, hesitancy, or urgency. No flank pain. No fever/chills. Has history of tubal ligation.   Past Medical History:  Diagnosis Date  . Chronic back pain   . DJD (degenerative joint disease)   . Fibromyalgia   . GERD (gastroesophageal reflux disease)   . Migraine     There are no problems to display for this patient.   Past Surgical History:  Procedure Laterality Date  . abdominal laproscopy x 2    . APPENDECTOMY    . CESAREAN SECTION     x 3  . TUBAL LIGATION      Allergies Aspirin and Metronidazole  Family History  Problem Relation Age of Onset  . Gout Mother   . Diabetes Other     Social History Social History   Tobacco Use  . Smoking status: Current Every Day Smoker    Packs/day: 1.00    Types: Cigarettes  . Smokeless tobacco: Never Used  Vaping Use  . Vaping Use: Never used  Substance Use Topics  . Alcohol use: Yes    Comment: Very Rare  . Drug use: Never    Review of Systems  Constitutional: No fever/chills Cardiovascular: Denies chest pain. Respiratory: Denies shortness of breath. Gastrointestinal: No abdominal pain.   Genitourinary: Negative for dysuria. Musculoskeletal: Chronic back pain and sciatica (left). Acute left hip pain.  Skin: Negative for rash. Neurological: Negative for focal weakness or numbness.   ____________________________________________   PHYSICAL EXAM:  VITAL SIGNS: ED Triage Vitals  Enc Vitals Group     BP 11/23/20 0339 (!) 173/88     Pulse Rate 11/23/20 0339 (!) 103     Resp 11/23/20 0339 18     Temp 11/23/20 0339 97.6 F (36.4 C)     Temp Source 11/23/20 0339 Oral     SpO2 11/23/20 0339 98 %     Weight 11/23/20 0339 164 lb (74.4 kg)     Height 11/23/20 0339 4\' 10"  (1.473 m)   Constitutional: Alert and oriented. Well appearing and in no acute distress. Eyes: Conjunctivae are normal.  Head: Atraumatic. Neck: No stridor.  Cardiovascular: Normal rate, regular rhythm. Good peripheral circulation. Grossly normal heart sounds.   Respiratory: Normal respiratory effort.  No retractions. Lungs CTAB. Gastrointestinal: No distention.  Musculoskeletal: No midline tenderness in the lumbar spine. No tenderness over the left knee or ankle.  Neurologic:  Normal speech and language. No gross focal neurologic deficits are appreciated. Normal strength and sensation in the lower extremities. 2+ patellar reflex on the left.  Skin:  Skin is warm, dry and intact.  No rash noted.  ____________________________________________  RADIOLOGY  DG Hip Unilat W or Wo Pelvis 2-3 Views Left  Result Date: 11/23/2020 CLINICAL DATA:  45 year old female with left hip pain after fall. EXAM: DG HIP (WITH OR WITHOUT PELVIS) 2-3V LEFT COMPARISON:  Abdominal radiographs 05/15/2020. FINDINGS: Bone mineralization is within normal limits, stable patchy sclerosis of the right femur greater trochanter since last year, and has a benign appearance. Femoral heads are normally located. Hip joint spaces appear stable and symmetric. Intact pelvis. SI joints appear normal. Grossly intact proximal right femur. Intact  proximal left femur. No acute osseous abnormality identified. Negative visible lower abdominal and pelvic visceral contours. IMPRESSION: No acute fracture or dislocation identified about the left hip or pelvis. Electronically Signed   By: Odessa Fleming M.D.   On: 11/23/2020 04:33    ____________________________________________   PROCEDURES  Procedure(s) performed:   Procedures  None ____________________________________________   INITIAL IMPRESSION / ASSESSMENT AND PLAN / ED COURSE  Pertinent labs & imaging results that were available during my care of the patient were reviewed by me and considered in my medical decision making (see chart for details).   Patient with history of chronic pain presents to the ED with acute pain primarily in the left hip. Back pain at baseline. No red flag signs/symptoms to suspect acute spine emergency or prompt spine imaging in the emergent setting. Exam reassuring. No knee/ankle tenderness. Will evaluate with plain films of the left hip/pelvis to evaluate for fx. Doubt dislocation as patient is ambulatory. Will give Norco here and Toradol which has helped in the past. Patient was driven here by family and has a ride home.   Plain films interpreted by me independently. Agree with radiology interpretation. Patient has had improvement with Flexeril in the past which was refilled. Cautioned regarding the drowsy side effects of this medication and not to drive or take with other pain medications. Will follow up with PCP and return with any new or worsening symptoms.  ____________________________________________  FINAL CLINICAL IMPRESSION(S) / ED DIAGNOSES  Final diagnoses:  Acute pain of left hip     MEDICATIONS GIVEN DURING THIS VISIT:  Medications  HYDROcodone-acetaminophen (NORCO/VICODIN) 5-325 MG per tablet 2 tablet (2 tablets Oral Given 11/23/20 0413)  ketorolac (TORADOL) 30 MG/ML injection 30 mg (30 mg Intramuscular Given 11/23/20 0414)     NEW  OUTPATIENT MEDICATIONS STARTED DURING THIS VISIT:  New Prescriptions   CYCLOBENZAPRINE (FLEXERIL) 10 MG TABLET    Take 1 tablet (10 mg total) by mouth 3 (three) times daily as needed for muscle spasms.    Note:  This document was prepared using Dragon voice recognition software and may include unintentional dictation errors.  Alona Bene, MD, [facility] Emergency Medicine    Deval Mroczka, Arlyss Repress, MD 11/23/20 [phone]

## 2021-02-06 ENCOUNTER — Emergency Department
Admission: EM | Admit: 2021-02-06 | Discharge: 2021-02-06 | Disposition: A | Discharge status: Home / Self Care | Payer: Medicaid Other | Source: Home / Self Care

## 2021-02-06 ENCOUNTER — Encounter: Payer: Self-pay | Admitting: Emergency Medicine

## 2021-02-06 ENCOUNTER — Other Ambulatory Visit: Payer: Self-pay

## 2021-02-06 ENCOUNTER — Ambulatory Visit: Payer: Self-pay

## 2021-02-06 DIAGNOSIS — T63481A Toxic effect of venom of other arthropod, accidental (unintentional), initial encounter: Secondary | ICD-10-CM

## 2021-02-06 DIAGNOSIS — T63451A Toxic effect of venom of hornets, accidental (unintentional), initial encounter: Secondary | ICD-10-CM | POA: Diagnosis not present

## 2021-02-06 MED ORDER — FAMOTIDINE 20 MG PO TABS: 20.0000 mg | ORAL_TABLET | Freq: Two times a day (BID) | ORAL | 0 refills | Status: DC

## 2021-02-06 MED ORDER — DIPHENHYDRAMINE HCL 25 MG PO TABS: 25.0000 mg | ORAL_TABLET | Freq: Three times a day (TID) | ORAL | 0 refills | Status: DC | PRN

## 2021-02-06 MED ORDER — PREDNISONE 50 MG PO TABS: ORAL_TABLET | ORAL | 0 refills | Status: DC

## 2021-02-06 NOTE — ED Provider Notes (Signed)
[facility]  ____________________________________________  Time seen: Approximately 3:31 PM  I have reviewed the triage vital signs and the nursing notes.   HISTORY  Chief Complaint Insect Bite   Historian Patient     HPI Beth Porter is a 45 y.o. female presents to the urgent care after patient was stung by a hornet along the right aspect of her neck.  Patient has noticed some localized swelling and some itching.  She denies difficulty swallowing or speaking.  No chest pain, chest tightness or shortness of breath.  No nausea, vomiting, diarrhea or syncope.  Patient has had similar reactions in the past when being stung by hornets.   Past Medical History:  Diagnosis Date   Chronic back pain    DJD (degenerative joint disease)    Fibromyalgia    GERD (gastroesophageal reflux disease)    Migraine      Immunizations up to date:  Yes.     Past Medical History:  Diagnosis Date   Chronic back pain    DJD (degenerative joint disease)    Fibromyalgia    GERD (gastroesophageal reflux disease)    Migraine     There are no problems to display for this patient.   Past Surgical History:  Procedure Laterality Date   abdominal laproscopy x 2     APPENDECTOMY     CESAREAN SECTION     x 3   TUBAL LIGATION      Prior to Admission medications   Medication Sig Start Date End Date Taking? Authorizing Provider  diphenhydrAMINE (BENADRYL) 25 MG tablet Take 1 tablet (25 mg total) by mouth every 8 (eight) hours as needed for up to 5 days. 02/06/21 02/11/21 Yes Pia Mau M, PA-C  famotidine (PEPCID) 20 MG tablet Take 1 tablet (20 mg total) by mouth 2 (two) times daily for 5 days. 02/06/21 02/11/21 Yes Orvil Feil, PA-C  HYDROcodone-acetaminophen (NORCO) 10-325 MG tablet Take 1 tablet by mouth every 2 (two) hours as needed for moderate pain.   Yes [provider]  ibuprofen (ADVIL,MOTRIN) 600 MG tablet Take 1 tablet (600 mg total) by mouth every 8  (eight) hours as needed. Take with food. 04/09/18  Yes Cathren Laine, MD  predniSONE (DELTASONE) 50 MG tablet Take one tablet once daily for five days. 02/06/21  Yes Pia Mau M, PA-C  pregabalin (LYRICA) 50 MG capsule Take 225 mg by mouth 2 (two) times daily.    Yes [provider]  promethazine (PHENERGAN) 25 MG tablet Take 1 tablet (25 mg total) by mouth every 6 (six) hours as needed for nausea or vomiting. 08/08/20  Yes Fondaw, Wylder S, PA  cyclobenzaprine (FLEXERIL) 10 MG tablet Take 1 tablet (10 mg total) by mouth 3 (three) times daily as needed for muscle spasms. 11/23/20   Long, Arlyss Repress, MD  lidocaine (LIDODERM) 5 % Place 1 patch onto the skin daily. Remove & Discard patch within 12 hours or as directed by MD 10/04/20   Alvira Monday, MD  ondansetron (ZOFRAN ODT) 4 MG disintegrating tablet Take 1 tablet (4 mg total) by mouth every 8 (eight) hours as needed for nausea or vomiting. 05/15/20   Sabas Sous, MD  ondansetron (ZOFRAN ODT) 4 MG disintegrating tablet 4mg  ODT q4 hours prn nausea/vomit 08/07/20   08/09/20, MD    Allergies Aspirin and Metronidazole  Family History  Problem Relation Age of Onset   Gout Mother    Diabetes Other     Social  History Social History   Tobacco Use   Smoking status: Every Day    Packs/day: 1.00    Types: Cigarettes   Smokeless tobacco: Never  Vaping Use   Vaping Use: Never used  Substance Use Topics   Alcohol use: Yes    Comment: Very Rare   Drug use: Never     Review of Systems  Constitutional: No fever/chills Eyes:  No discharge ENT: No upper respiratory complaints. Respiratory: no cough. No SOB/ use of accessory muscles to breath Gastrointestinal:   No nausea, no vomiting.  No diarrhea.  No constipation. Musculoskeletal: Negative for musculoskeletal pain. Skin: Patient has insect sting at right neck.    ____________________________________________   PHYSICAL EXAM:  VITAL SIGNS: ED Triage Vitals  Enc  Vitals Group     BP 02/06/21 1513 (!) 150/88     Pulse Rate 02/06/21 1513 95     Resp --      Temp --      Temp src --      SpO2 02/06/21 1513 96 %     Weight 02/06/21 1514 160 lb (72.6 kg)     Height 02/06/21 1514 4\' 10"  (1.473 m)     Head Circumference --      Peak Flow --      Pain Score 02/06/21 1514 5     Pain Loc --      Pain Edu? --      Excl. in GC? --      Constitutional: Alert and oriented. Well appearing and in no acute distress. Eyes: Conjunctivae are normal. PERRL. EOMI. Head: Atraumatic. ENT:  Nose: No congestion/rhinnorhea.      Mouth/Throat: Mucous membranes are moist.  Neck: No stridor.  No cervical spine tenderness to palpation. Cardiovascular: Normal rate, regular rhythm. Normal S1 and S2.  Good peripheral circulation. Respiratory: Normal respiratory effort without tachypnea or retractions. Lungs CTAB. Good air entry to the bases with no decreased or absent breath sounds Gastrointestinal: Bowel sounds x 4 quadrants. Soft and nontender to palpation. No guarding or rigidity. No distention. Musculoskeletal: Full range of motion to all extremities. No obvious deformities noted Neurologic:  Normal for age. No gross focal neurologic deficits are appreciated.  Skin: Patient has mild swelling at right neck where hornet sting sites located. Psychiatric: Mood and affect are normal for age. Speech and behavior are normal.   ____________________________________________   LABS (all labs ordered are listed, but only abnormal results are displayed)  Labs Reviewed - No data to display ____________________________________________  EKG   ____________________________________________  RADIOLOGY  No results found.  ____________________________________________    PROCEDURES  Procedure(s) performed:     Procedures     Medications - No data to display   ____________________________________________   INITIAL IMPRESSION / ASSESSMENT AND PLAN / ED  COURSE  Pertinent labs & imaging results that were available during my care of the patient were reviewed by me and considered in my medical decision making (see chart for details).      Assessment and plan Insect bite/stings 45 year old female presents to the urgent care after she was stung by a hornet.  Patient was prescribed Benadryl, Pepcid and prednisone.  Patient was able to speak in complete sentences and manage her own secretions.  Edema surrounding insect bite/sting was mild in nature.  Patient was cautioned that should she experience worsening swelling, shortness of breath, dysphagia or other new or worsening symptoms that she should seek care at a local emergency department.  ____________________________________________  FINAL CLINICAL IMPRESSION(S) / ED DIAGNOSES  Final diagnoses:  Insect stings, accidental or unintentional, initial encounter      NEW MEDICATIONS STARTED DURING THIS VISIT:  ED Discharge Orders          Ordered    predniSONE (DELTASONE) 50 MG tablet        02/06/21 1527    diphenhydrAMINE (BENADRYL) 25 MG tablet  Every 8 hours PRN        02/06/21 1527    famotidine (PEPCID) 20 MG tablet  2 times daily        02/06/21 1527                This chart was dictated using voice recognition software/Dragon. Despite best efforts to proofread, errors can occur which can change the meaning. Any change was purely unintentional.     Orvil Feil, PA-C 02/06/21 1535

## 2021-02-06 NOTE — Discharge Instructions (Addendum)
Take prednisone once daily for 5 days. Take Benadryl every 8 hours. Take 40 mg of Pepcid once daily.

## 2021-02-06 NOTE — ED Triage Notes (Signed)
Patient c/o hornet sting on the right side of face,neck and ear on Thursday.  Patient is having some redness, pain and swelling.  Patient has taken Ibuprofen and Benadryl.

## 2021-02-09 ENCOUNTER — Emergency Department
Admission: RE | Admit: 2021-02-09 | Discharge: 2021-02-09 | Disposition: A | Discharge status: Home / Self Care | Payer: Medicaid Other | Source: Ambulatory Visit

## 2021-02-09 ENCOUNTER — Other Ambulatory Visit: Payer: Self-pay

## 2021-02-09 VITALS — BP 149/97 | HR 84 | Temp 98.1°F | Resp 18 | Ht <= 58 in | Wt 160.0 lb

## 2021-02-09 DIAGNOSIS — H9201 Otalgia, right ear: Secondary | ICD-10-CM

## 2021-02-09 DIAGNOSIS — R590 Localized enlarged lymph nodes: Secondary | ICD-10-CM

## 2021-02-09 NOTE — Discharge Instructions (Addendum)
Advised patient may alternate between Tylenol and ibuprofen for right ear pain/discomfort.  Advised patient if lymph nodes of neck become painful, non-mobile, and or do not reduce over the next 1 to 2 weeks please follow-up with PCP for further evaluation and possible imaging.

## 2021-02-09 NOTE — ED Triage Notes (Signed)
Rt side neck pain from hornet sting x 6 days ago, Rt ear pain. Taking Prednisone, benadryl and Pepcid. Swelling has improved, lymph node is not going down.

## 2021-02-09 NOTE — ED Provider Notes (Signed)
[facility]    CSN: [id number] Arrival date & time: 02/09/21  1006      History   Chief Complaint Chief Complaint  Patient presents with   Sore Throat    HPI Beth Porter is a 45 y.o. female.   HPI 45 year old female presents with right ear pain and lymphadenopathy of right side of neck for 3 days.  Patient was recently evaluated here on 02/06/2021 for hornet sting(s) and was prescribed Prednisone and Benadryl.  Past Medical History:  Diagnosis Date   Chronic back pain    DJD (degenerative joint disease)    Fibromyalgia    GERD (gastroesophageal reflux disease)    Migraine     There are no problems to display for this patient.   Past Surgical History:  Procedure Laterality Date   abdominal laproscopy x 2     APPENDECTOMY     CESAREAN SECTION     x 3   TUBAL LIGATION      OB History   No obstetric history on file.      Home Medications    Prior to Admission medications   Medication Sig Start Date End Date Taking? Authorizing Provider  cyclobenzaprine (FLEXERIL) 10 MG tablet Take 1 tablet (10 mg total) by mouth 3 (three) times daily as needed for muscle spasms. 11/23/20   Long, Arlyss Repress, MD  diphenhydrAMINE (BENADRYL) 25 MG tablet Take 1 tablet (25 mg total) by mouth every 8 (eight) hours as needed for up to 5 days. 02/06/21 02/11/21  Orvil Feil, PA-C  famotidine (PEPCID) 20 MG tablet Take 1 tablet (20 mg total) by mouth 2 (two) times daily for 5 days. 02/06/21 02/11/21  Orvil Feil, PA-C  HYDROcodone-acetaminophen (NORCO) 10-325 MG tablet Take 1 tablet by mouth every 2 (two) hours as needed for moderate pain.    [provider]  ibuprofen (ADVIL,MOTRIN) 600 MG tablet Take 1 tablet (600 mg total) by mouth every 8 (eight) hours as needed. Take with food. 04/09/18   Cathren Laine, MD  lidocaine (LIDODERM) 5 % Place 1 patch onto the skin daily. Remove & Discard patch within 12 hours or as directed by MD 10/04/20   Alvira Monday, MD   ondansetron (ZOFRAN ODT) 4 MG disintegrating tablet Take 1 tablet (4 mg total) by mouth every 8 (eight) hours as needed for nausea or vomiting. 05/15/20   Sabas Sous, MD  ondansetron (ZOFRAN ODT) 4 MG disintegrating tablet 4mg  ODT q4 hours prn nausea/vomit 08/07/20   08/09/20, MD  predniSONE (DELTASONE) 50 MG tablet Take one tablet once daily for five days. 02/06/21   02/08/21, PA-C  pregabalin (LYRICA) 50 MG capsule Take 225 mg by mouth 2 (two) times daily.     [provider]  promethazine (PHENERGAN) 25 MG tablet Take 1 tablet (25 mg total) by mouth every 6 (six) hours as needed for nausea or vomiting. 08/08/20   08/10/20, PA    Family History Family History  Problem Relation Age of Onset   Gout Mother    Diabetes Other     Social History Social History   Tobacco Use   Smoking status: Every Day    Packs/day: 1.00    Types: Cigarettes   Smokeless tobacco: Never  Vaping Use   Vaping Use: Never used  Substance Use Topics   Alcohol use: Yes    Comment: Very Rare   Drug use: Never     Allergies  Aspirin and Metronidazole   Review of Systems Review of Systems  HENT:  Positive for ear pain.        Enlarged lymph nodes (right-sided) of neck x 3 days    Physical Exam Triage Vital Signs ED Triage Vitals  Enc Vitals Group     BP 02/09/21 1022 (!) 149/97     Pulse Rate 02/09/21 1022 84     Resp 02/09/21 1022 18     Temp 02/09/21 1022 98.1 F (36.7 C)     Temp Source 02/09/21 1022 Oral     SpO2 02/09/21 1022 97 %     Weight 02/09/21 1023 160 lb (72.6 kg)     Height 02/09/21 1023 4\' 10"  (1.473 m)     Head Circumference --      Peak Flow --      Pain Score 02/09/21 1023 4     Pain Loc --      Pain Edu? --      Excl. in GC? --    No data found.  Updated Vital Signs BP (!) 149/97 (BP Location: Right Arm)   Pulse 84   Temp 98.1 F (36.7 C) (Oral)   Resp 18   Ht 4\' 10"  (1.473 m)   Wt 160 lb (72.6 kg)   SpO2 97%   BMI 33.44  kg/m     Physical Exam Vitals and nursing note reviewed.  Constitutional:      General: She is not in acute distress.    Appearance: Normal appearance. She is normal weight. She is not ill-appearing.  HENT:     Head: Normocephalic and atraumatic.     Right Ear: Tympanic membrane, ear canal and external ear normal.     Left Ear: Tympanic membrane, ear canal and external ear normal.     Mouth/Throat:     Mouth: Mucous membranes are moist.     Pharynx: Oropharynx is clear. No oropharyngeal exudate or posterior oropharyngeal erythema.  Eyes:     Extraocular Movements: Extraocular movements intact.     Conjunctiva/sclera: Conjunctivae normal.     Pupils: Pupils are equal, round, and reactive to light.  Neck:     Comments: Mild to moderate right-sided anterior cervical lymphadenopathy noted, TTP Cardiovascular:     Rate and Rhythm: Normal rate and regular rhythm.     Pulses: Normal pulses.     Heart sounds: Normal heart sounds.  Pulmonary:     Effort: Pulmonary effort is normal.     Breath sounds: Normal breath sounds. No wheezing, rhonchi or rales.  Musculoskeletal:        General: Normal range of motion.     Cervical back: Normal range of motion and neck supple. Tenderness present.  Lymphadenopathy:     Cervical: Cervical adenopathy present.  Skin:    General: Skin is warm and dry.  Neurological:     General: No focal deficit present.     Mental Status: She is alert and oriented to person, place, and time. Mental status is at baseline.  Psychiatric:        Mood and Affect: Mood normal.        Behavior: Behavior normal.     UC Treatments / Results  Labs (all labs ordered are listed, but only abnormal results are displayed) Labs Reviewed - No data to display  EKG   Radiology No results found.  Procedures Procedures (including critical care time)  Medications Ordered in UC Medications - No data to display  Initial Impression / Assessment and Plan / UC Course  I  have reviewed the triage vital signs and the nursing notes.  Pertinent labs & imaging results that were available during my care of the patient were reviewed by me and considered in my medical decision making (see chart for details).     MDM: 1.  Right otalgia-Advised patient may alternate between Tylenol and ibuprofen for right ear pain/discomfort. 2.  Anterior cervical lymphadenopathy-Advised patient if lymph nodes of neck become painful, non-mobile, and or do not reduce over the next 1 to 2 weeks please follow-up with PCP for further evaluation and possible imaging.  At home, hemodynamically stable. Final Clinical Impressions(s) / UC Diagnoses   Final diagnoses:  Otalgia, right  Lymphadenopathy, cervical     Discharge Instructions      Advised patient may alternate between Tylenol and ibuprofen for right ear pain/discomfort.  Advised patient if lymph nodes of neck become painful, non-mobile, and or do not reduce over the next 1 to 2 weeks please follow-up with PCP for further evaluation and possible imaging.     ED Prescriptions   None    PDMP not reviewed this encounter.   Trevor Iha, FNP 02/09/21 1049

## 2021-02-23 ENCOUNTER — Emergency Department
Admission: EM | Admit: 2021-02-23 | Discharge: 2021-02-23 | Disposition: A | Discharge status: Home / Self Care | Payer: Medicaid Other | Source: Home / Self Care

## 2021-02-23 ENCOUNTER — Other Ambulatory Visit: Payer: Self-pay

## 2021-02-23 DIAGNOSIS — J029 Acute pharyngitis, unspecified: Secondary | ICD-10-CM

## 2021-02-23 DIAGNOSIS — R059 Cough, unspecified: Secondary | ICD-10-CM

## 2021-02-23 MED ORDER — METHYLPREDNISOLONE 4 MG PO TBPK: ORAL_TABLET | ORAL | 0 refills | Status: DC

## 2021-02-23 NOTE — Discharge Instructions (Addendum)
Advised/instructed patient to take medication as directed with food to completion.  Encouraged patient to increase daily water intake while taking this medication. Advised/encouraged patient to smoke less cigarettes per day.  Advised patient may take OTC Ibuprofen 600-800 mg 1-2 times a day, as needed for sore throat pain.

## 2021-02-23 NOTE — ED Triage Notes (Signed)
Pt presents with new onset of sore throat, and HA, cough.pt st she lives with her daughter and she tested positive for covid two weeks ago. Pt st she has had four negative test for covid. Pt has been taking ibuprofen.

## 2021-02-23 NOTE — ED Provider Notes (Signed)
[facility]    CSN: [id number] Arrival date & time: 02/23/21  1343      History   Chief Complaint Chief Complaint  Patient presents with   Sore Throat    HPI Beth Porter is a 45 y.o. female.   HPI 45 year old female presents with sore throat, headache and cough for 4 days.  Patient is current smoker and reports smoking 1/2 pack per day with 10 pack-year history of cigarette smoking  Past Medical History:  Diagnosis Date   Chronic back pain    DJD (degenerative joint disease)    Fibromyalgia    GERD (gastroesophageal reflux disease)    Migraine     There are no problems to display for this patient.   Past Surgical History:  Procedure Laterality Date   abdominal laproscopy x 2     APPENDECTOMY     CESAREAN SECTION     x 3   TUBAL LIGATION      OB History   No obstetric history on file.      Home Medications    Prior to Admission medications   Medication Sig Start Date End Date Taking? Authorizing Provider  methylPREDNISolone (MEDROL DOSEPAK) 4 MG TBPK tablet Take as directed 02/23/21  Yes Trevor Iha, FNP  cyclobenzaprine (FLEXERIL) 10 MG tablet Take 1 tablet (10 mg total) by mouth 3 (three) times daily as needed for muscle spasms. 11/23/20   Long, Arlyss Repress, MD  diphenhydrAMINE (BENADRYL) 25 MG tablet Take 1 tablet (25 mg total) by mouth every 8 (eight) hours as needed for up to 5 days. 02/06/21 02/11/21  Orvil Feil, PA-C  famotidine (PEPCID) 20 MG tablet Take 1 tablet (20 mg total) by mouth 2 (two) times daily for 5 days. 02/06/21 02/11/21  Orvil Feil, PA-C  HYDROcodone-acetaminophen (NORCO) 10-325 MG tablet Take 1 tablet by mouth every 2 (two) hours as needed for moderate pain.    [provider]  ibuprofen (ADVIL,MOTRIN) 600 MG tablet Take 1 tablet (600 mg total) by mouth every 8 (eight) hours as needed. Take with food. 04/09/18   Cathren Laine, MD  lidocaine (LIDODERM) 5 % Place 1 patch onto the skin daily. Remove & Discard patch  within 12 hours or as directed by MD 10/04/20   Alvira Monday, MD  ondansetron (ZOFRAN ODT) 4 MG disintegrating tablet Take 1 tablet (4 mg total) by mouth every 8 (eight) hours as needed for nausea or vomiting. 05/15/20   Sabas Sous, MD  ondansetron (ZOFRAN ODT) 4 MG disintegrating tablet 4mg  ODT q4 hours prn nausea/vomit 08/07/20   08/09/20, MD  predniSONE (DELTASONE) 50 MG tablet Take one tablet once daily for five days. 02/06/21   02/08/21, PA-C  pregabalin (LYRICA) 50 MG capsule Take 225 mg by mouth 2 (two) times daily.     [provider]  promethazine (PHENERGAN) 25 MG tablet Take 1 tablet (25 mg total) by mouth every 6 (six) hours as needed for nausea or vomiting. 08/08/20   08/10/20, PA    Family History Family History  Problem Relation Age of Onset   Gout Mother    Diabetes Other     Social History Social History   Tobacco Use   Smoking status: Every Day    Packs/day: 0.50    Years: 20.00    Pack years: 10.00    Types: Cigarettes   Smokeless tobacco: Never  Vaping Use   Vaping Use: Never used  Substance  Use Topics   Alcohol use: Yes    Comment: Very Rare   Drug use: Never     Allergies   Aspirin, Flavoring agent, and Metronidazole   Review of Systems Review of Systems  HENT:  Positive for sore throat.   Respiratory:  Positive for cough.     Physical Exam Triage Vital Signs ED Triage Vitals  Enc Vitals Group     BP 02/23/21 1403 (!) 140/93     Pulse Rate 02/23/21 1403 (!) 109     Resp 02/23/21 1403 16     Temp 02/23/21 1403 98.4 F (36.9 C)     Temp Source 02/23/21 1403 Oral     SpO2 02/23/21 1403 96 %     Weight 02/23/21 1358 160 lb (72.6 kg)     Height 02/23/21 1358 4\' 10"  (1.473 m)     Head Circumference --      Peak Flow --      Pain Score 02/23/21 1358 4     Pain Loc --      Pain Edu? --      Excl. in GC? --    No data found.  Updated Vital Signs BP (!) 140/93 (BP Location: Right Arm)   Pulse (!) 109    Temp 98.4 F (36.9 C) (Oral)   Resp 16   Ht 4\' 10"  (1.473 m)   Wt 160 lb (72.6 kg)   LMP 12/29/2020   SpO2 96%   BMI 33.44 kg/m    Physical Exam Vitals and nursing note reviewed.  Constitutional:      General: She is not in acute distress.    Appearance: Normal appearance. She is obese. She is not ill-appearing.  HENT:     Head: Normocephalic and atraumatic.     Right Ear: Tympanic membrane, ear canal and external ear normal.     Left Ear: Tympanic membrane, ear canal and external ear normal.     Mouth/Throat:     Mouth: Mucous membranes are moist.     Pharynx: Oropharynx is clear. No oropharyngeal exudate or posterior oropharyngeal erythema.  Eyes:     Extraocular Movements: Extraocular movements intact.     Conjunctiva/sclera: Conjunctivae normal.     Pupils: Pupils are equal, round, and reactive to light.  Cardiovascular:     Rate and Rhythm: Normal rate and regular rhythm.     Pulses: Normal pulses.     Heart sounds: Normal heart sounds. No murmur heard. Pulmonary:     Effort: Pulmonary effort is normal.     Breath sounds: Normal breath sounds. No wheezing, rhonchi or rales.  Musculoskeletal:        General: Normal range of motion.     Cervical back: Normal range of motion and neck supple. No tenderness.  Lymphadenopathy:     Cervical: No cervical adenopathy.  Skin:    General: Skin is warm and dry.  Neurological:     General: No focal deficit present.     Mental Status: She is alert and oriented to person, place, and time.  Psychiatric:        Mood and Affect: Mood normal.        Behavior: Behavior normal.     UC Treatments / Results  Labs (all labs ordered are listed, but only abnormal results are displayed) Labs Reviewed - No data to display  EKG   Radiology No results found.  Procedures Procedures (including critical care time)  Medications Ordered in UC Medications -  No data to display  Initial Impression / Assessment and Plan / UC Course   I have reviewed the triage vital signs and the nursing notes.  Pertinent labs & imaging results that were available during my care of the patient were reviewed by me and considered in my medical decision making (see chart for details).     MDM: 1.  Cough-Rx Medrol Dosepak; 2.  Viral pharyngitis-advised patient may take OTC Ibuprofen 600-800 mg 1-2 times daily, as needed for sore throat pain.  Patient discharged home, hemodynamically stable. Final Clinical Impressions(s) / UC Diagnoses   Final diagnoses:  Cough  Viral pharyngitis     Discharge Instructions      Advised/instructed patient to take medication as directed with food to completion.  Encouraged patient to increase daily water intake while taking this medication. Advised/encouraged patient to smoke less cigarettes per day.     ED Prescriptions     Medication Sig Dispense Auth. Provider   methylPREDNISolone (MEDROL DOSEPAK) 4 MG TBPK tablet Take as directed 1 each Trevor Iha, FNP      PDMP not reviewed this encounter.   Trevor Iha, FNP 02/23/21 1450

## 2021-03-04 ENCOUNTER — Emergency Department (INDEPENDENT_AMBULATORY_CARE_PROVIDER_SITE_OTHER)
Admission: EM | Admit: 2021-03-04 | Discharge: 2021-03-04 | Disposition: A | Discharge status: Home / Self Care | Payer: Medicaid Other | Source: Home / Self Care

## 2021-03-04 ENCOUNTER — Other Ambulatory Visit: Payer: Self-pay

## 2021-03-04 ENCOUNTER — Emergency Department (INDEPENDENT_AMBULATORY_CARE_PROVIDER_SITE_OTHER): Payer: Medicaid Other

## 2021-03-04 DIAGNOSIS — R059 Cough, unspecified: Secondary | ICD-10-CM | POA: Diagnosis not present

## 2021-03-04 DIAGNOSIS — J3489 Other specified disorders of nose and nasal sinuses: Secondary | ICD-10-CM

## 2021-03-04 DIAGNOSIS — B9689 Other specified bacterial agents as the cause of diseases classified elsewhere: Secondary | ICD-10-CM

## 2021-03-04 DIAGNOSIS — R051 Acute cough: Secondary | ICD-10-CM | POA: Diagnosis not present

## 2021-03-04 DIAGNOSIS — R0602 Shortness of breath: Secondary | ICD-10-CM | POA: Diagnosis not present

## 2021-03-04 MED ORDER — PREDNISONE 20 MG PO TABS: ORAL_TABLET | ORAL | 0 refills | Status: DC

## 2021-03-04 MED ORDER — AMOXICILLIN-POT CLAVULANATE 875-125 MG PO TABS: 1.0000 | ORAL_TABLET | Freq: Two times a day (BID) | ORAL | 0 refills | Status: DC

## 2021-03-04 MED ORDER — BENZONATATE 200 MG PO CAPS: 200.0000 mg | ORAL_CAPSULE | Freq: Three times a day (TID) | ORAL | 0 refills | Status: DC | PRN

## 2021-03-04 NOTE — Discharge Instructions (Addendum)
Advised/instructed patient to take medications as directed with food to completion.  Encouraged patient to increase daily water intake while taking these medications. 

## 2021-03-04 NOTE — ED Provider Notes (Signed)
[facility]    CSN: [id number] Arrival date & time: 03/04/21  1549      History   Chief Complaint Chief Complaint  Patient presents with   Sore Throat    X3 d   Nasal Congestion    HPI Beth Porter is a 45 y.o. female.   HPI 45 year old female presents with congestion, sore throat, otalgia, and cough for 3 days.  Patient was evaluated here by me on 02/23/2021 please see epic for that encounter note.  Patient is every day smoker, reports currently smokes 10+ cigarettes per day.  Past Medical History:  Diagnosis Date   Chronic back pain    DJD (degenerative joint disease)    Fibromyalgia    GERD (gastroesophageal reflux disease)    Migraine     There are no problems to display for this patient.   Past Surgical History:  Procedure Laterality Date   abdominal laproscopy x 2     APPENDECTOMY     CESAREAN SECTION     x 3   TUBAL LIGATION      OB History   No obstetric history on file.      Home Medications    Prior to Admission medications   Medication Sig Start Date End Date Taking? Authorizing Provider  amoxicillin-clavulanate (AUGMENTIN) 875-125 MG tablet Take 1 tablet by mouth 2 (two) times daily for 7 days. 03/04/21 03/11/21 Yes Trevor Iha, FNP  benzonatate (TESSALON) 200 MG capsule Take 1 capsule (200 mg total) by mouth 3 (three) times daily as needed for up to 7 days for cough. 03/04/21 03/11/21 Yes Trevor Iha, FNP  predniSONE (DELTASONE) 20 MG tablet Take 3 tabs PO x 5 days. 03/04/21  Yes Trevor Iha, FNP  cyclobenzaprine (FLEXERIL) 10 MG tablet Take 1 tablet (10 mg total) by mouth 3 (three) times daily as needed for muscle spasms. Patient not taking: Reported on 03/04/2021 11/23/20   Long, Arlyss Repress, MD  diphenhydrAMINE (BENADRYL) 25 MG tablet Take 1 tablet (25 mg total) by mouth every 8 (eight) hours as needed for up to 5 days. Patient not taking: Reported on 03/04/2021 02/06/21 02/11/21  Orvil Feil, PA-C  famotidine (PEPCID) 20 MG  tablet Take 1 tablet (20 mg total) by mouth 2 (two) times daily for 5 days. Patient not taking: Reported on 03/04/2021 02/06/21 02/11/21  Orvil Feil, PA-C  HYDROcodone-acetaminophen [facility]) 10-325 MG tablet Take 1 tablet by mouth every 2 (two) hours as needed for moderate pain.    [provider]  ibuprofen (ADVIL,MOTRIN) 600 MG tablet Take 1 tablet (600 mg total) by mouth every 8 (eight) hours as needed. Take with food. 04/09/18   Cathren Laine, MD  lidocaine (LIDODERM) 5 % Place 1 patch onto the skin daily. Remove & Discard patch within 12 hours or as directed by MD Patient not taking: Reported on 03/04/2021 10/04/20   Alvira Monday, MD  ondansetron (ZOFRAN ODT) 4 MG disintegrating tablet Take 1 tablet (4 mg total) by mouth every 8 (eight) hours as needed for nausea or vomiting. Patient not taking: Reported on 03/04/2021 05/15/20   Sabas Sous, MD  ondansetron (ZOFRAN ODT) 4 MG disintegrating tablet 4mg  ODT q4 hours prn nausea/vomit Patient not taking: Reported on 03/04/2021 08/07/20   08/09/20, MD  pregabalin (LYRICA) 50 MG capsule Take 225 mg by mouth 2 (two) times daily.     [provider]  promethazine (PHENERGAN) 25 MG tablet Take 1 tablet (25 mg total) by mouth  every 6 (six) hours as needed for nausea or vomiting. 08/08/20   Gailen Shelter, PA    Family History Family History  Problem Relation Age of Onset   Gout Mother    Diabetes Other     Social History Social History   Tobacco Use   Smoking status: Every Day    Packs/day: 0.50    Years: 20.00    Pack years: 10.00    Types: Cigarettes   Smokeless tobacco: Never  Vaping Use   Vaping Use: Never used  Substance Use Topics   Alcohol use: Yes    Comment: Very Rare   Drug use: Never     Allergies   Aspirin, Flavoring agent, and Metronidazole   Review of Systems Review of Systems  HENT:  Positive for congestion, ear pain and sore throat.   Respiratory:  Positive for cough.      Physical Exam Triage Vital Signs ED Triage Vitals  Enc Vitals Group     BP      Pulse      Resp      Temp      Temp src      SpO2      Weight      Height      Head Circumference      Peak Flow      Pain Score      Pain Loc      Pain Edu?      Excl. in GC?    No data found.  Updated Vital Signs BP (!) 147/84 (BP Location: Left Arm)   Pulse (!) 104   Temp 98.4 F (36.9 C) (Oral)   Resp 16   Ht 4\' 10"  (1.473 m)   Wt 160 lb (72.6 kg)   SpO2 97%   BMI 33.44 kg/m      Physical Exam Vitals and nursing note reviewed.  Constitutional:      General: She is not in acute distress.    Appearance: Normal appearance. She is obese. She is ill-appearing.  HENT:     Head: Normocephalic and atraumatic.     Right Ear: Tympanic membrane and external ear normal.     Left Ear: Tympanic membrane and external ear normal.     Ears:     Comments: Eustachian tube dysfunction noted -bilaterally     Nose: Congestion present.     Right Sinus: Maxillary sinus tenderness present.     Left Sinus: Maxillary sinus tenderness present.     Comments: Turbinates are erythematous, TTP over maxillary sinuses bilaterally    Mouth/Throat:     Mouth: Mucous membranes are moist.     Pharynx: Oropharynx is clear.  Eyes:     Extraocular Movements: Extraocular movements intact.     Conjunctiva/sclera: Conjunctivae normal.     Pupils: Pupils are equal, round, and reactive to light.  Cardiovascular:     Rate and Rhythm: Normal rate and regular rhythm.     Pulses: Normal pulses.     Heart sounds: Normal heart sounds.  Pulmonary:     Effort: Pulmonary effort is normal. No respiratory distress.     Breath sounds: Normal breath sounds. No wheezing, rhonchi or rales.  Musculoskeletal:        General: Normal range of motion.     Cervical back: Normal range of motion and neck supple. Tenderness present.  Lymphadenopathy:     Cervical: Cervical adenopathy present.  Skin:    General: Skin is warm  and  dry.  Neurological:     General: No focal deficit present.     Mental Status: She is alert and oriented to person, place, and time. Mental status is at baseline.  Psychiatric:        Mood and Affect: Mood normal.        Behavior: Behavior normal.        Thought Content: Thought content normal.     UC Treatments / Results  Labs (all labs ordered are listed, but only abnormal results are displayed) Labs Reviewed - No data to display  EKG   Radiology DG Chest 2 View  Result Date: 03/04/2021 CLINICAL DATA:  Cough and short of breath EXAM: CHEST - 2 VIEW COMPARISON:  10/04/2020 FINDINGS: The heart size and mediastinal contours are within normal limits. Both lungs are clear. The visualized skeletal structures are unremarkable. IMPRESSION: No active cardiopulmonary disease. Electronically Signed   By: Jasmine Pang M.D.   On: 03/04/2021 16:36    Procedures Procedures (including critical care time)  Medications Ordered in UC Medications - No data to display  Initial Impression / Assessment and Plan / UC Course  I have reviewed the triage vital signs and the nursing notes.  Pertinent labs & imaging results that were available during my care of the patient were reviewed by me and considered in my medical decision making (see chart for details).     MDM: 1. Cough-CXR reveals no acute cardiopulmonary process, Rx'd Tessalon Perles; 2.  Acute bacterial rhinosinusitis-Rx'd AugmeAdvised/instructed patient to take medications as directed with food to completion.  Encouraged patient to increase daily water intake while taking these medications.ntin; 3. Sinus pressure-Rx'd prednisone burst.  Work excuse note provided to patient prior to discharge.  Patient discharged home, hemodynamically stable. Final Clinical Impressions(s) / UC Diagnoses   Final diagnoses:  Cough  Acute bacterial rhinosinusitis  Sinus pressure     Discharge Instructions      Advised/instructed patient to take  medications as directed with food to completion.  Encouraged patient to increase daily water intake while taking these medications.     ED Prescriptions     Medication Sig Dispense Auth. Provider   amoxicillin-clavulanate (AUGMENTIN) 875-125 MG tablet Take 1 tablet by mouth 2 (two) times daily for 7 days. 14 tablet Trevor Iha, FNP   predniSONE (DELTASONE) 20 MG tablet Take 3 tabs PO x 5 days. 15 tablet Trevor Iha, FNP   benzonatate (TESSALON) 200 MG capsule Take 1 capsule (200 mg total) by mouth 3 (three) times daily as needed for up to 7 days for cough. 30 capsule Trevor Iha, FNP      PDMP not reviewed this encounter.   Trevor Iha, FNP 03/04/21 1659

## 2021-03-04 NOTE — ED Triage Notes (Signed)
Pt seen in uc w/ c/o nasal congestion, otalgia, sore throat, cough x3 days. Pt had neg covid test x2.

## 2021-03-10 ENCOUNTER — Emergency Department (INDEPENDENT_AMBULATORY_CARE_PROVIDER_SITE_OTHER)
Admission: EM | Admit: 2021-03-10 | Discharge: 2021-03-10 | Disposition: A | Discharge status: Home / Self Care | Payer: Medicaid Other | Source: Home / Self Care | Attending: Family Medicine | Admitting: Family Medicine

## 2021-03-10 ENCOUNTER — Other Ambulatory Visit: Payer: Self-pay

## 2021-03-10 DIAGNOSIS — R0603 Acute respiratory distress: Secondary | ICD-10-CM

## 2021-03-10 DIAGNOSIS — R062 Wheezing: Secondary | ICD-10-CM | POA: Diagnosis not present

## 2021-03-10 DIAGNOSIS — F17200 Nicotine dependence, unspecified, uncomplicated: Secondary | ICD-10-CM | POA: Diagnosis not present

## 2021-03-10 DIAGNOSIS — J069 Acute upper respiratory infection, unspecified: Secondary | ICD-10-CM

## 2021-03-10 MED ORDER — ALBUTEROL SULFATE HFA 108 (90 BASE) MCG/ACT IN AERS
2.0000 | INHALATION_SPRAY | Freq: Once | RESPIRATORY_TRACT | Status: AC
  Administered 2021-03-10: 2 via RESPIRATORY_TRACT

## 2021-03-10 MED ORDER — METHYLPREDNISOLONE SODIUM SUCC 125 MG IJ SOLR
125.0000 mg | Freq: Once | INTRAMUSCULAR | Status: AC
  Administered 2021-03-10: 125 mg via INTRAMUSCULAR

## 2021-03-10 MED ORDER — PREDNISONE 20 MG PO TABS: ORAL_TABLET | ORAL | 0 refills | Status: DC

## 2021-03-10 MED ORDER — PREDNISONE 20 MG PO TABS: ORAL_TABLET | ORAL | 0 refills | Status: AC

## 2021-03-10 MED ORDER — PROMETHAZINE-DM 6.25-15 MG/5ML PO SYRP: 5.0000 mL | ORAL_SOLUTION | Freq: Four times a day (QID) | ORAL | 0 refills | Status: DC | PRN

## 2021-03-10 NOTE — Discharge Instructions (Addendum)
Take the prednisone as directed.  Start your prednisone tomorrow Use Phenergan DM as needed for cough You must drink lots of water Use a humidifier in your bedroom if you have 1, or if you can borrow 1 Use your albuterol inhaler 2 puffs 3 times a day every day until you start to improve.  This will help with your shortness of breath and coughing.  You may get back on albuterol as you get better See your family doctor next week for follow-up

## 2021-03-10 NOTE — ED Provider Notes (Addendum)
[facility]    CSN: [id number] Arrival date & time: 03/10/21  1755      History   Chief Complaint No chief complaint on file.   HPI Beth Porter is a 45 y.o. female.   HPI Beth Porter has been seen a couple times for viral URI.  She is a chronic cigarette smoker.  She has had wheezing and was given an albuterol inhaler.  She does not usually have COPD or asthma.  She has done multiple COVID test that were negative.  She has had COVID vaccinations.  She is here because she is finishing her last day of Augmentin today, and finished her prednisone, and is worse than she was prior to treatment.  She has had 2 courses of steroids.  She has no fever or chills.  She has some sinus congestion and headache.  She has wheezing, shortness of breath, and fatigue.  She has chest pain from all the coughing.  She is using Robitussin for the cough.  She could not afford to pick up the [facility] because of expense.  She has not been able to work because of her illness.  She is trying to drink enough fluids.  She has cut way back on her smoking and states she can barely take a puff without having increased coughing and shortness of breath. Past Medical History:  Diagnosis Date   Chronic back pain    DJD (degenerative joint disease)    Fibromyalgia    GERD (gastroesophageal reflux disease)    Migraine     There are no problems to display for this patient.   Past Surgical History:  Procedure Laterality Date   abdominal laproscopy x 2     APPENDECTOMY     CESAREAN SECTION     x 3   TUBAL LIGATION      OB History   No obstetric history on file.      Home Medications    Prior to Admission medications   Medication Sig Start Date End Date Taking? Authorizing Provider  promethazine-dextromethorphan (PROMETHAZINE-DM) 6.25-15 MG/5ML syrup Take 5 mLs by mouth 4 (four) times daily as needed for cough. 03/10/21  Yes Eustace Moore, MD  HYDROcodone-acetaminophen [facility]) 10-325 MG  tablet Take 1 tablet by mouth every 2 (two) hours as needed for moderate pain.    [provider]  ibuprofen (ADVIL,MOTRIN) 600 MG tablet Take 1 tablet (600 mg total) by mouth every 8 (eight) hours as needed. Take with food. 04/09/18   Cathren Laine, MD  predniSONE (DELTASONE) 20 MG tablet Take 3 tabs a day for 5 days, then: Take 2 times a day for 5 days ;then discontinue 03/10/21   Eustace Moore, MD  pregabalin (LYRICA) 50 MG capsule Take 225 mg by mouth 2 (two) times daily.     [provider]  promethazine (PHENERGAN) 25 MG tablet Take 1 tablet (25 mg total) by mouth every 6 (six) hours as needed for nausea or vomiting. 08/08/20   Gailen Shelter, PA    Family History Family History  Problem Relation Age of Onset   Gout Mother    Healthy Father    Diabetes Other     Social History Social History   Tobacco Use   Smoking status: Every Day    Packs/day: 0.50    Years: 20.00    Pack years: 10.00    Types: Cigarettes   Smokeless tobacco: Never  Vaping Use   Vaping Use: Never used  Substance Use Topics   Alcohol use: Yes    Comment: Very Rare   Drug use: Never     Allergies   Aspirin, Flavoring agent, and Metronidazole   Review of Systems Review of Systems See HPI  Physical Exam Triage Vital Signs ED Triage Vitals  Enc Vitals Group     BP --      Pulse Rate 03/10/21 1809 99     Resp 03/10/21 1809 (!) 24     Temp 03/10/21 1809 98.2 F (36.8 C)     Temp Source 03/10/21 1809 Oral     SpO2 03/10/21 1809 (S) 93 %     Weight 03/10/21 1806 160 lb (72.6 kg)     Height 03/10/21 1806 4\' 10"  (1.473 m)     Head Circumference --      Peak Flow --      Pain Score 03/10/21 1806 3     Pain Loc --      Pain Edu? --      Excl. in GC? --    No data found.  Updated Vital Signs BP 118/80 (BP Location: Right Arm)   Pulse (!) 102   Temp 98.2 F (36.8 C) (Oral)   Resp 20   Ht 4\' 10"  (1.473 m)   Wt 72.6 kg   SpO2 93%   BMI 33.44 kg/m      Physical  Exam Constitutional:      General: She is in acute distress.     Appearance: She is well-developed.     Comments: Overweight  HENT:     Head: Normocephalic and atraumatic.     Right Ear: Tympanic membrane and ear canal normal.     Left Ear: Tympanic membrane and ear canal normal.     Nose: Nose normal.     Mouth/Throat:     Mouth: Mucous membranes are dry.     Pharynx: No posterior oropharyngeal erythema.  Eyes:     Conjunctiva/sclera: Conjunctivae normal.     Pupils: Pupils are equal, round, and reactive to light.  Cardiovascular:     Rate and Rhythm: Regular rhythm. Tachycardia present.     Heart sounds: Normal heart sounds.  Pulmonary:     Effort: Pulmonary effort is normal. No respiratory distress.     Breath sounds: Wheezing and rhonchi present. No rales.     Comments: Decreased air excursion.  Wheezes throughout.  Rhonchi heard anteriorly.  No rales Abdominal:     General: There is no distension.     Palpations: Abdomen is soft.  Musculoskeletal:        General: Normal range of motion.     Cervical back: Normal range of motion.  Lymphadenopathy:     Cervical: No cervical adenopathy.  Skin:    General: Skin is warm and dry.  Neurological:     Mental Status: She is alert.     UC Treatments / Results  Labs (all labs ordered are listed, but only abnormal results are displayed) Labs Reviewed - No data to display  EKG   Radiology No results found.  Procedures Procedures (including critical care time)  Medications Ordered in UC Medications  methylPREDNISolone sodium succinate (SOLU-MEDROL) 125 mg/2 mL injection 125 mg (125 mg Intramuscular Given 03/10/21 1839)  albuterol (VENTOLIN HFA) 108 (90 Base) MCG/ACT inhaler 2 puff (2 puffs Inhalation Given 03/10/21 1840)    Initial Impression / Assessment and Plan / UC Course  I have reviewed the triage vital signs and the nursing  notes.  Pertinent labs & imaging results that were available during my care of the  patient were reviewed by me and considered in my medical decision making (see chart for details).     Patient was reexamined after her Depo-Medrol and albuterol inhaler.  She was much improved with only scattered wheezing.  She still has some tachypnea but I do not know if this is normal for her.  I discussed with her that if she fails to improve with a longer course of steroids and more assiduous use of her albuterol, she needs to go to the emergency room and may need admission.  Chest x-ray was not repeated since she had clear lungs after her treatment.  It was just done last week.  Additional antibiotics not thought to be indicated.  She needs to see her primary care doctor for work-up for early COPD as I suspect this is at the bottom of what is causing her protracted wheezing after viral infection. Final Clinical Impressions(s) / UC Diagnoses   Final diagnoses:  Viral URI with cough  Respiratory distress  Wheezing  Tobacco dependence     Discharge Instructions      Take the prednisone as directed.  Start your prednisone tomorrow Use Phenergan DM as needed for cough You must drink lots of water Use a humidifier in your bedroom if you have 1, or if you can borrow 1 Use your albuterol inhaler 2 puffs 3 times a day every day until you start to improve.  This will help with your shortness of breath and coughing.  You may get back on albuterol as you get better See your family doctor next week for follow-up     ED Prescriptions     Medication Sig Dispense Auth. Provider   predniSONE (DELTASONE) 20 MG tablet  (Status: Discontinued) Take 3 tabs PO x 5 days.  Take 2 times a day for 5 days then discontinue 15 tablet Eustace Moore, MD   promethazine-dextromethorphan (PROMETHAZINE-DM) 6.25-15 MG/5ML syrup Take 5 mLs by mouth 4 (four) times daily as needed for cough. 240 mL Eustace Moore, MD   predniSONE (DELTASONE) 20 MG tablet Take 3 tabs a day for 5 days, then: Take 2 times a day  for 5 days ;then discontinue 25 tablet Delton See Letta Pate, MD      PDMP not reviewed this encounter.   Eustace Moore, MD 03/10/21 Norberta Keens    Eustace Moore, MD 03/10/21 [phone]

## 2021-03-10 NOTE — ED Triage Notes (Signed)
Pt st she was here last week and was diagnoses with sininitis and was prescribed an antibiotic and a steroid, pt st she has pain in her L ear, cough, nasal drainage, and chest tightness.

## 2021-03-15 ENCOUNTER — Emergency Department (INDEPENDENT_AMBULATORY_CARE_PROVIDER_SITE_OTHER)
Admission: RE | Admit: 2021-03-15 | Discharge: 2021-03-15 | Disposition: A | Discharge status: Home / Self Care | Payer: Medicaid Other | Source: Ambulatory Visit | Attending: Family Medicine | Admitting: Family Medicine

## 2021-03-15 ENCOUNTER — Other Ambulatory Visit: Payer: Self-pay

## 2021-03-15 VITALS — BP 160/97 | HR 87 | Temp 97.9°F | Resp 18 | Wt 183.3 lb

## 2021-03-15 DIAGNOSIS — H65192 Other acute nonsuppurative otitis media, left ear: Secondary | ICD-10-CM

## 2021-03-15 MED ORDER — FLUTICASONE PROPIONATE 50 MCG/ACT NA SUSP: 2.0000 | Freq: Every day | NASAL | 0 refills | Status: AC

## 2021-03-15 MED ORDER — AMOXICILLIN 875 MG PO TABS: 875.0000 mg | ORAL_TABLET | Freq: Two times a day (BID) | ORAL | 0 refills | Status: AC

## 2021-03-15 NOTE — ED Provider Notes (Signed)
[facility]    CSN: [id number] Arrival date & time: 03/15/21  0851      History   Chief Complaint Chief Complaint  Patient presents with   Otalgia    left   Fatigue   appointment @9a     HPI Beth Porter is a 45 y.o. female.   HPI  Patient is here for follow-up.  This is her fourth visit during the month of August for upper respiratory complaints.  She states that her breathing is better on the prednisone taper offered her last visit.  She is using her albuterol regularly.  She is here today because she has developed ear pain.  Her left ear is very painful, hearing is muffled, she is worried about an ear infection.  She is not prone to ear infections.  He has mild sore throat and sinus congestion persisting   Past Medical History:  Diagnosis Date   Chronic back pain    DJD (degenerative joint disease)    Fibromyalgia    GERD (gastroesophageal reflux disease)    Migraine     There are no problems to display for this patient.   Past Surgical History:  Procedure Laterality Date   abdominal laproscopy x 2     APPENDECTOMY     CESAREAN SECTION     x 3   TUBAL LIGATION      OB History   No obstetric history on file.      Home Medications    Prior to Admission medications   Medication Sig Start Date End Date Taking? Authorizing Provider  albuterol (VENTOLIN HFA) 108 (90 Base) MCG/ACT inhaler Inhale into the lungs. 06/01/20  Yes [provider]  amoxicillin (AMOXIL) 875 MG tablet Take 1 tablet (875 mg total) by mouth 2 (two) times daily for 10 days. 03/15/21 03/25/21 Yes 05/25/21, MD  fluticasone [facility]) 50 MCG/ACT nasal spray Place 2 sprays into both nostrils daily. 03/15/21  Yes 03/17/21, MD  HYDROcodone-acetaminophen [facility]) 10-325 MG tablet Take 1 tablet by mouth every 2 (two) hours as needed for moderate pain.    [provider]  ibuprofen (ADVIL,MOTRIN) 600 MG tablet Take 1 tablet (600 mg total) by mouth every  8 (eight) hours as needed. Take with food. 04/09/18   04/11/18, MD  predniSONE (DELTASONE) 20 MG tablet Take 3 tabs a day for 5 days, then: Take 2 times a day for 5 days ;then discontinue 03/10/21   03/12/21, MD  pregabalin (LYRICA) 50 MG capsule Take 225 mg by mouth 2 (two) times daily.     [provider]  promethazine (PHENERGAN) 25 MG tablet Take 1 tablet (25 mg total) by mouth every 6 (six) hours as needed for nausea or vomiting. 08/08/20   08/10/20, PA    Family History Family History  Problem Relation Age of Onset   Gout Mother    Healthy Father    Diabetes Other     Social History Social History   Tobacco Use   Smoking status: Every Day    Packs/day: 0.50    Years: 20.00    Pack years: 10.00    Types: Cigarettes   Smokeless tobacco: Never  Vaping Use   Vaping Use: Never used  Substance Use Topics   Alcohol use: Yes    Comment: Very Rare   Drug use: Never     Allergies   Aspirin, Flavoring agent, and Metronidazole   Review of Systems Review  of Systems See HPI  Physical Exam Triage Vital Signs ED Triage Vitals  Enc Vitals Group     BP 03/15/21 0901 (!) 160/97     Pulse Rate 03/15/21 0901 87     Resp 03/15/21 0901 18     Temp 03/15/21 0901 97.9 F (36.6 C)     Temp Source 03/15/21 0901 Oral     SpO2 03/15/21 0901 95 %     Weight 03/15/21 0905 183 lb 4.8 oz (83.1 kg)     Height --      Head Circumference --      Peak Flow --      Pain Score 03/15/21 0909 5     Pain Loc --      Pain Edu? --      Excl. in GC? --    No data found.  Updated Vital Signs BP (!) 160/97 (BP Location: Right Arm)   Pulse 87   Temp 97.9 F (36.6 C) (Oral)   Resp 18   Wt 83.1 kg   SpO2 95%   BMI 38.31 kg/m      Physical Exam Constitutional:      General: She is not in acute distress.    Appearance: She is well-developed. She is obese.     Comments: Voice is hoarse  HENT:     Head: Normocephalic and atraumatic.     Right Ear:  Tympanic membrane, ear canal and external ear normal.     Left Ear: Ear canal and external ear normal.     Ears:     Comments: Left TM has yellow fluid, bulging, erythema around periphery    Nose: Congestion present.     Mouth/Throat:     Mouth: Mucous membranes are moist.     Pharynx: No posterior oropharyngeal erythema.  Eyes:     Conjunctiva/sclera: Conjunctivae normal.     Pupils: Pupils are equal, round, and reactive to light.  Cardiovascular:     Rate and Rhythm: Normal rate and regular rhythm.     Heart sounds: Normal heart sounds.  Pulmonary:     Effort: Pulmonary effort is normal. No respiratory distress.     Breath sounds: Wheezing and rhonchi present.     Comments: Few wheezes and inspiration.  Anterior rhonchi Abdominal:     General: There is no distension.     Palpations: Abdomen is soft.  Musculoskeletal:        General: Normal range of motion.     Cervical back: Normal range of motion.  Lymphadenopathy:     Cervical: No cervical adenopathy.  Skin:    General: Skin is warm and dry.  Neurological:     Mental Status: She is alert.  Psychiatric:        Mood and Affect: Mood normal.        Behavior: Behavior normal.     UC Treatments / Results  Labs (all labs ordered are listed, but only abnormal results are displayed) Labs Reviewed - No data to display  EKG   Radiology No results found.  Procedures Procedures (including critical care time)  Medications Ordered in UC Medications - No data to display  Initial Impression / Assessment and Plan / UC Course  I have reviewed the triage vital signs and the nursing notes.  Pertinent labs & imaging results that were available during my care of the patient were reviewed by me and considered in my medical decision making (see chart for details).  I have encouraged patient, again, to see her primary care doctor regarding this significant wheezing episode from viral illness.  With her years of smoking I  have a suspicion she may have underlying COPD.  Today we will prescribe amoxicillin for her ear infection. Final Clinical Impressions(s) / UC Diagnoses   Final diagnoses:  Other non-recurrent acute nonsuppurative otitis media of left ear     Discharge Instructions      Drink lots of fluids Take Amoxil 2 times a day Continue with your prednisone taper, continue albuterol as needed Make an appointment with your primary care doctor for follow-up.  I think you may need additional lung testing If you have a lot of nasal and sinus congestion, use Flonase as directed   ED Prescriptions     Medication Sig Dispense Auth. Provider   amoxicillin (AMOXIL) 875 MG tablet Take 1 tablet (875 mg total) by mouth 2 (two) times daily for 10 days. 20 tablet Eustace Moore, MD   fluticasone [facility]) 50 MCG/ACT nasal spray Place 2 sprays into both nostrils daily. 16 g Eustace Moore, MD      PDMP not reviewed this encounter.   Eustace Moore, MD 03/15/21 1011

## 2021-03-15 NOTE — ED Triage Notes (Signed)
Last seen on 8/24 with similar sxs and tx with a prednisone course.  Since the last OV, her left ear sxs have increased noting decreased hearing. She also c/o fatigue and sore throat. Cough has improved some.

## 2021-03-15 NOTE — Discharge Instructions (Addendum)
Drink lots of fluids Take Amoxil 2 times a day Continue with your prednisone taper, continue albuterol as needed Make an appointment with your primary care doctor for follow-up.  I think you may need additional lung testing If you have a lot of nasal and sinus congestion, use Flonase as directed

## 2021-03-20 ENCOUNTER — Other Ambulatory Visit: Payer: Self-pay

## 2021-03-20 ENCOUNTER — Emergency Department (HOSPITAL_BASED_OUTPATIENT_CLINIC_OR_DEPARTMENT_OTHER): Payer: Medicaid Other

## 2021-03-20 ENCOUNTER — Emergency Department (HOSPITAL_BASED_OUTPATIENT_CLINIC_OR_DEPARTMENT_OTHER)
Admission: EM | Admit: 2021-03-20 | Discharge: 2021-03-20 | Disposition: A | Discharge status: Home / Self Care | Payer: Medicaid Other | Attending: Emergency Medicine | Admitting: Emergency Medicine

## 2021-03-20 ENCOUNTER — Encounter (HOSPITAL_BASED_OUTPATIENT_CLINIC_OR_DEPARTMENT_OTHER): Payer: Self-pay

## 2021-03-20 DIAGNOSIS — M545 Low back pain, unspecified: Secondary | ICD-10-CM | POA: Insufficient documentation

## 2021-03-20 DIAGNOSIS — R0981 Nasal congestion: Secondary | ICD-10-CM | POA: Insufficient documentation

## 2021-03-20 DIAGNOSIS — M25551 Pain in right hip: Secondary | ICD-10-CM | POA: Diagnosis not present

## 2021-03-20 DIAGNOSIS — S335XXA Sprain of ligaments of lumbar spine, initial encounter: Secondary | ICD-10-CM

## 2021-03-20 DIAGNOSIS — F17210 Nicotine dependence, cigarettes, uncomplicated: Secondary | ICD-10-CM | POA: Insufficient documentation

## 2021-03-20 DIAGNOSIS — W19XXXA Unspecified fall, initial encounter: Secondary | ICD-10-CM

## 2021-03-20 MED ORDER — KETOROLAC TROMETHAMINE 30 MG/ML IJ SOLN
30.0000 mg | Freq: Once | INTRAMUSCULAR | Status: AC
  Administered 2021-03-20: 30 mg via INTRAMUSCULAR
  Filled 2021-03-20: qty 1

## 2021-03-20 MED ORDER — METHOCARBAMOL 500 MG PO TABS: 500.0000 mg | ORAL_TABLET | Freq: Three times a day (TID) | ORAL | 0 refills | Status: AC | PRN

## 2021-03-20 MED ORDER — HYDROCODONE-ACETAMINOPHEN 5-325 MG PO TABS
2.0000 | ORAL_TABLET | Freq: Once | ORAL | Status: AC
  Administered 2021-03-20: 2 via ORAL
  Filled 2021-03-20: qty 2

## 2021-03-20 NOTE — ED Triage Notes (Signed)
Pt lost her balance while trying to get in the shower, states her foot slipped. Pt c/o right hip and lower back pain. States it was already hurting yesterday. Has had recent ear infection as well.

## 2021-03-20 NOTE — ED Provider Notes (Signed)
[facility] EMERGENCY DEPARTMENT Provider Note   CSN: [id number] Arrival date & time: 03/20/21  0715     History Chief Complaint  Patient presents with   Beth Porter    Beth Porter is a 45 y.o. female.   Fall Pertinent negatives include no chest pain, no abdominal pain and no shortness of breath. Patient presents after fall.  States she lost her balance and slipped while in the shower.  States she has had an ear infection has been unsteady.  Hit her right hip on the tub.  States she has chronic pain in his hip and back and sees pain management for periods denies other injury.  Is on hydrocodone chronically through pain management.  States she has not taken it today because she has had nasal congestion that has been severe.  States has been draining down her throat.  No weakness.  States she was getting ready for work.  States she had not been going due to the nasal congestion.     Past Medical History:  Diagnosis Date   Chronic back pain    DJD (degenerative joint disease)    Fibromyalgia    GERD (gastroesophageal reflux disease)    Migraine     There are no problems to display for this patient.   Past Surgical History:  Procedure Laterality Date   abdominal laproscopy x 2     APPENDECTOMY     CESAREAN SECTION     x 3   TUBAL LIGATION       OB History   No obstetric history on file.     Family History  Problem Relation Age of Onset   Gout Mother    Healthy Father    Diabetes Other     Social History   Tobacco Use   Smoking status: Every Day    Packs/day: 0.50    Years: 20.00    Pack years: 10.00    Types: Cigarettes   Smokeless tobacco: Never  Vaping Use   Vaping Use: Never used  Substance Use Topics   Alcohol use: Yes    Comment: Very Rare   Drug use: Never    Home Medications Prior to Admission medications   Medication Sig Start Date End Date Taking? Authorizing Provider  albuterol (VENTOLIN HFA) 108 (90 Base) MCG/ACT inhaler Inhale  into the lungs. 06/01/20   [provider]  amoxicillin (AMOXIL) 875 MG tablet Take 1 tablet (875 mg total) by mouth 2 (two) times daily for 10 days. 03/15/21 03/25/21  Eustace Moore, MD  fluticasone (FLONASE) 50 MCG/ACT nasal spray Place 2 sprays into both nostrils daily. 03/15/21   Eustace Moore, MD  HYDROcodone-acetaminophen [facility]) 10-325 MG tablet Take 1 tablet by mouth every 2 (two) hours as needed for moderate pain.    [provider]  ibuprofen (ADVIL,MOTRIN) 600 MG tablet Take 1 tablet (600 mg total) by mouth every 8 (eight) hours as needed. Take with food. 04/09/18   Cathren Laine, MD  predniSONE (DELTASONE) 20 MG tablet Take 3 tabs a day for 5 days, then: Take 2 times a day for 5 days ;then discontinue 03/10/21   Eustace Moore, MD  pregabalin (LYRICA) 50 MG capsule Take 225 mg by mouth 2 (two) times daily.     [provider]  promethazine (PHENERGAN) 25 MG tablet Take 1 tablet (25 mg total) by mouth every 6 (six) hours as needed for nausea or vomiting. 08/08/20   Gailen Shelter, PA  Allergies    Aspirin, Flavoring agent, and Metronidazole  Review of Systems   Review of Systems  Constitutional:  Negative for appetite change.  HENT:  Positive for congestion, ear pain and sore throat.   Respiratory:  Negative for shortness of breath.   Cardiovascular:  Negative for chest pain.  Gastrointestinal:  Negative for abdominal pain.  Genitourinary:  Negative for flank pain.  Musculoskeletal:  Positive for back pain.  Skin:  Negative for wound.  Neurological:  Negative for weakness and numbness.  Psychiatric/Behavioral:  Negative for confusion.    Physical Exam Updated Vital Signs BP (!) 156/89 (BP Location: Right Arm)   Pulse (!) 116   Temp 97.8 F (36.6 C) (Oral)   Resp 18   Ht 4\' 10"  (1.473 m)   Wt 81.6 kg   SpO2 98%   BMI 37.62 kg/m   Physical Exam Vitals reviewed.  HENT:     Head: Atraumatic.  Eyes:     Pupils: Pupils are equal,  round, and reactive to light.  Cardiovascular:     Rate and Rhythm: Regular rhythm.  Pulmonary:     Breath sounds: No wheezing or rhonchi.  Abdominal:     Tenderness: There is no abdominal tenderness.  Musculoskeletal:        General: Tenderness present.     Cervical back: Neck supple.     Comments: Tenderness over lower lumbar spine.  No deformity.  No crepitance.  Mild right hip tenderness.  Chronic for patient and at baseline.  Neurovascular tact in right foot.  Skin:    General: Skin is warm.     Capillary Refill: Capillary refill takes less than 2 seconds.  Neurological:     Mental Status: She is alert and oriented to person, place, and time.    ED Results / Procedures / Treatments   Labs (all labs ordered are listed, but only abnormal results are displayed) Labs Reviewed - No data to display  EKG None  Radiology No results found.  Procedures Procedures   Medications Ordered in ED Medications  HYDROcodone-acetaminophen (NORCO/VICODIN) 5-325 MG per tablet 2 tablet (has no administration in time range)  ketorolac (TORADOL) 30 MG/ML injection 30 mg (30 mg Intramuscular Given 03/20/21 0749)    ED Course  I have reviewed the triage vital signs and the nursing notes.  Pertinent labs & imaging results that were available during my care of the patient were reviewed by me and considered in my medical decision making (see chart for details).    MDM Rules/Calculators/A&P                         patient with low back pain.  Began after fall.  X-ray done due to tenderness.  X-rays stable.  Feels little better after Toradol shot.  We will add muscle laxer to her chronic pain medicines at home.  Will discharge home.   Final Clinical Impression(s) / ED Diagnoses Final diagnoses:  None    Rx / DC Orders ED Discharge Orders     None        05/20/21, MD 03/20/21 1021

## 2021-03-26 ENCOUNTER — Other Ambulatory Visit: Payer: Self-pay

## 2021-03-26 ENCOUNTER — Ambulatory Visit: Payer: Medicaid Other

## 2021-03-26 ENCOUNTER — Emergency Department (INDEPENDENT_AMBULATORY_CARE_PROVIDER_SITE_OTHER)
Admission: RE | Admit: 2021-03-26 | Discharge: 2021-03-26 | Disposition: A | Discharge status: Home / Self Care | Payer: Medicaid Other | Source: Ambulatory Visit

## 2021-03-26 VITALS — BP 148/90 | HR 111 | Temp 99.0°F | Resp 18 | Ht <= 58 in | Wt 179.9 lb

## 2021-03-26 DIAGNOSIS — J309 Allergic rhinitis, unspecified: Secondary | ICD-10-CM

## 2021-03-26 DIAGNOSIS — H6983 Other specified disorders of Eustachian tube, bilateral: Secondary | ICD-10-CM | POA: Diagnosis not present

## 2021-03-26 DIAGNOSIS — J029 Acute pharyngitis, unspecified: Secondary | ICD-10-CM

## 2021-03-26 MED ORDER — FEXOFENADINE HCL 180 MG PO TABS: 180.0000 mg | ORAL_TABLET | Freq: Every day | ORAL | 0 refills | Status: AC

## 2021-03-26 NOTE — ED Triage Notes (Signed)
Pt here for continued sore throat complaints  Cough  R ear pain  Was treated recently for left ear infection OTC meds - robitussin DM last night  Pt states phenergan DM works for her  Insurance does not cover tessalon  COVID vaccine x 2 - no booster

## 2021-03-26 NOTE — Discharge Instructions (Addendum)
Advised patient to take Allegra daily for the next 7 days, then as needed.  Encouraged patient to follow-up with her PCP for CT of maxillary sinuses for chronic sinusitis and otalgia.  Encourage patient increase daily water intake while taking these medications.  Advised patient may alternate OTC Tylenol 1000 mg 1-2 times daily, as needed with OTC ibuprofen 600 mg 1-2 times daily, as needed for severe throat pain.

## 2021-03-26 NOTE — ED Provider Notes (Signed)
[facility]    CSN: [id number] Arrival date & time: 03/26/21  1653      History   Chief Complaint Chief Complaint  Patient presents with   Cough   Sore Throat    HPI Beth Porter is a 45 y.o. female.   HPI 45 year old female presents with sore throat, cough and right ear pain.  Patient was treated for left ear infection with amoxicillin x 10 days on 03/15/2021.  Past Medical History:  Diagnosis Date   Chronic back pain    COVID-19 07/2020   DJD (degenerative joint disease)    Fibromyalgia    GERD (gastroesophageal reflux disease)    Migraine     There are no problems to display for this patient.   Past Surgical History:  Procedure Laterality Date   abdominal laproscopy x 2     APPENDECTOMY     CESAREAN SECTION     x 3   TUBAL LIGATION      OB History   No obstetric history on file.      Home Medications    Prior to Admission medications   Medication Sig Start Date End Date Taking? Authorizing Provider  fexofenadine (ALLEGRA ALLERGY) 180 MG tablet Take 1 tablet (180 mg total) by mouth daily for 15 days. 03/26/21 04/10/21 Yes Trevor Iha, FNP  albuterol (VENTOLIN HFA) 108 (90 Base) MCG/ACT inhaler Inhale into the lungs. 06/01/20   [provider]  fluticasone (FLONASE) 50 MCG/ACT nasal spray Place 2 sprays into both nostrils daily. 03/15/21   Eustace Moore, MD  HYDROcodone-acetaminophen [facility]) 10-325 MG tablet Take 1 tablet by mouth every 2 (two) hours as needed for moderate pain.    [provider]  ibuprofen (ADVIL,MOTRIN) 600 MG tablet Take 1 tablet (600 mg total) by mouth every 8 (eight) hours as needed. Take with food. 04/09/18   Cathren Laine, MD  methocarbamol (ROBAXIN) 500 MG tablet Take 1 tablet (500 mg total) by mouth every 8 (eight) hours as needed for muscle spasms. 03/20/21   Benjiman Core, MD  predniSONE (DELTASONE) 20 MG tablet Take 3 tabs a day for 5 days, then: Take 2 times a day for 5 days ;then  discontinue Patient not taking: Reported on 03/26/2021 03/10/21   Eustace Moore, MD  pregabalin (LYRICA) 50 MG capsule Take 225 mg by mouth 2 (two) times daily.     [provider]  promethazine (PHENERGAN) 25 MG tablet Take 1 tablet (25 mg total) by mouth every 6 (six) hours as needed for nausea or vomiting. 08/08/20   Gailen Shelter, PA    Family History Family History  Problem Relation Age of Onset   Gout Mother    Healthy Father    Diabetes Other     Social History Social History   Tobacco Use   Smoking status: Every Day    Packs/day: 0.50    Years: 20.00    Pack years: 10.00    Types: Cigarettes   Smokeless tobacco: Never  Vaping Use   Vaping Use: Never used  Substance Use Topics   Alcohol use: Yes    Comment: Very Rare   Drug use: Never     Allergies   Metronidazole, Flavoring agent, and Aspirin   Review of Systems Review of Systems  HENT:  Positive for ear pain and sore throat.   Respiratory:  Positive for cough.   All other systems reviewed and are negative.   Physical Exam Triage Vital Signs ED  Triage Vitals  Enc Vitals Group     BP 03/26/21 1738 (!) 148/90     Pulse Rate 03/26/21 1738 (!) 111     Resp 03/26/21 1738 18     Temp 03/26/21 1738 99 F (37.2 C)     Temp Source 03/26/21 1738 Oral     SpO2 03/26/21 1738 96 %     Weight 03/26/21 1739 179 lb 14.3 oz (81.6 kg)     Height 03/26/21 1739 4\' 10"  (1.473 m)     Head Circumference --      Peak Flow --      Pain Score 03/26/21 1738 4     Pain Loc --      Pain Edu? --      Excl. in GC? --    No data found.  Updated Vital Signs BP (!) 148/90 (BP Location: Right Arm)   Pulse (!) 111   Temp 99 F (37.2 C) (Oral)   Resp 18   Ht 4\' 10"  (1.473 m)   Wt 179 lb 14.3 oz (81.6 kg)   SpO2 96%   BMI 37.60 kg/m      Physical Exam Vitals reviewed.  Constitutional:      General: She is not in acute distress.    Appearance: She is well-developed. She is obese. She is not  ill-appearing.  HENT:     Head: Normocephalic and atraumatic.     Right Ear: Tympanic membrane normal.     Left Ear: Tympanic membrane normal.     Ears:     Comments: Eustachian tube dysfunction noted bilaterally    Mouth/Throat:     Mouth: Mucous membranes are moist.     Pharynx: Oropharynx is clear. Uvula midline. No posterior oropharyngeal erythema.     Comments: Moderate amount of clear drainage of posterior oropharynx noted Eyes:     Conjunctiva/sclera: Conjunctivae normal.     Pupils: Pupils are equal, round, and reactive to light.  Cardiovascular:     Rate and Rhythm: Normal rate and regular rhythm.     Heart sounds: Normal heart sounds. No murmur heard.   No friction rub. No gallop.  Pulmonary:     Effort: Pulmonary effort is normal.     Breath sounds: Normal breath sounds. No wheezing, rhonchi or rales.  Musculoskeletal:     Cervical back: Normal range of motion and neck supple.  Lymphadenopathy:     Cervical: No cervical adenopathy.  Skin:    General: Skin is warm and dry.  Neurological:     General: No focal deficit present.     Mental Status: She is alert and oriented to person, place, and time.  Psychiatric:        Mood and Affect: Mood normal.        Behavior: Behavior normal.     UC Treatments / Results  Labs (all labs ordered are listed, but only abnormal results are displayed) Labs Reviewed - No data to display  EKG   Radiology No results found.  Procedures Procedures (including critical care time)  Medications Ordered in UC Medications - No data to display  Initial Impression / Assessment and Plan / UC Course  I have reviewed the triage vital signs and the nursing notes.  Pertinent labs & imaging results that were available during my care of the patient were reviewed by me and considered in my medical decision making (see chart for details).     MDM 1.  Allergic rhinitis, unspecified seasonality, unspecified  trigger-Rx'd Allegra; 2.   Eustachian tube dysfunction, bilateral-Advised patient to take Allegra daily for the next 7 days, then as needed; 3.  Viral pharyngitis-Advised patient may alternate OTC Tylenol 1000 mg 1-2 times daily, as needed with OTC ibuprofen 600 mg 1-2 times daily, as needed for severe throat pain. Encouraged patient to follow-up with her PCP for CT of maxillary sinuses for chronic sinusitis and otalgia.  Encourage patient increase daily water intake while taking these medications.  Patient discharged home, hemodynamically stable. Final Clinical Impressions(s) / UC Diagnoses   Final diagnoses:  Allergic rhinitis, unspecified seasonality, unspecified trigger  Eustachian tube dysfunction, bilateral  Viral pharyngitis     Discharge Instructions      Advised patient to take Allegra daily for the next 7 days, then as needed.  Encouraged patient to follow-up with her PCP for CT of maxillary sinuses for chronic sinusitis and otalgia.  Encourage patient increase daily water intake while taking these medications.  Advised patient may alternate OTC Tylenol 1000 mg 1-2 times daily, as needed with OTC ibuprofen 600 mg 1-2 times daily, as needed for severe throat pain.     ED Prescriptions     Medication Sig Dispense Auth. Provider   fexofenadine (ALLEGRA ALLERGY) 180 MG tablet Take 1 tablet (180 mg total) by mouth daily for 15 days. 15 tablet Trevor Iha, FNP      PDMP not reviewed this encounter.   Trevor Iha, FNP 03/26/21 [phone]

## 2021-05-11 ENCOUNTER — Other Ambulatory Visit: Payer: Self-pay

## 2021-05-12 ENCOUNTER — Emergency Department (INDEPENDENT_AMBULATORY_CARE_PROVIDER_SITE_OTHER)
Admission: EM | Admit: 2021-05-12 | Discharge: 2021-05-12 | Disposition: A | Discharge status: Home / Self Care | Payer: Medicaid Other | Source: Home / Self Care

## 2021-05-12 ENCOUNTER — Other Ambulatory Visit: Payer: Self-pay

## 2021-05-12 DIAGNOSIS — R112 Nausea with vomiting, unspecified: Secondary | ICD-10-CM

## 2021-05-12 DIAGNOSIS — R519 Headache, unspecified: Secondary | ICD-10-CM

## 2021-05-12 MED ORDER — METOCLOPRAMIDE HCL 5 MG/ML IJ SOLN
10.0000 mg | Freq: Once | INTRAMUSCULAR | Status: AC
  Administered 2021-05-12: 10 mg via INTRAMUSCULAR

## 2021-05-12 MED ORDER — ONDANSETRON 4 MG PO TBDP: 4.0000 mg | ORAL_TABLET | Freq: Three times a day (TID) | ORAL | 0 refills | Status: AC | PRN

## 2021-05-12 MED ORDER — KETOROLAC TROMETHAMINE 60 MG/2ML IM SOLN
60.0000 mg | Freq: Once | INTRAMUSCULAR | Status: AC
  Administered 2021-05-12: 60 mg via INTRAMUSCULAR

## 2021-05-12 NOTE — Discharge Instructions (Addendum)
Advised patient may take Zofran daily, as needed for nausea.  Advised patient to adhere to bland/brat diet for the next 3 to 5 days gradually returning to normal diet.

## 2021-05-12 NOTE — ED Provider Notes (Signed)
[facility]    CSN: [id number] Arrival date & time: 05/12/21  1911      History   Chief Complaint Chief Complaint  Patient presents with   Headache    X 3d   Emesis    HPI Beth Porter is a 45 y.o. female.   HPI 45 year old female presents with headache and emesis x3 days.  Patient reports taking Toradol in the past without adverse reaction.  Past Medical History:  Diagnosis Date   Chronic back pain    COVID-19 07/2020   DJD (degenerative joint disease)    Fibromyalgia    GERD (gastroesophageal reflux disease)    Migraine     There are no problems to display for this patient.   Past Surgical History:  Procedure Laterality Date   abdominal laproscopy x 2     APPENDECTOMY     CESAREAN SECTION     x 3   TUBAL LIGATION      OB History   No obstetric history on file.      Home Medications    Prior to Admission medications   Medication Sig Start Date End Date Taking? Authorizing Provider  ondansetron (ZOFRAN ODT) 4 MG disintegrating tablet Take 1 tablet (4 mg total) by mouth every 8 (eight) hours as needed for nausea or vomiting. 05/12/21  Yes Trevor Iha, FNP  albuterol (VENTOLIN HFA) 108 (90 Base) MCG/ACT inhaler Inhale into the lungs. 06/01/20   [provider]  fexofenadine (ALLEGRA ALLERGY) 180 MG tablet Take 1 tablet (180 mg total) by mouth daily for 15 days. Patient not taking: Reported on 05/12/2021 03/26/21 04/10/21  Trevor Iha, FNP  fluticasone [facility]) 50 MCG/ACT nasal spray Place 2 sprays into both nostrils daily. Patient not taking: Reported on 05/12/2021 03/15/21   Eustace Moore, MD  HYDROcodone-acetaminophen [facility]) 10-325 MG tablet Take 1 tablet by mouth every 2 (two) hours as needed for moderate pain.    [provider]  ibuprofen (ADVIL,MOTRIN) 600 MG tablet Take 1 tablet (600 mg total) by mouth every 8 (eight) hours as needed. Take with food. 04/09/18   Cathren Laine, MD  methocarbamol (ROBAXIN) 500 MG  tablet Take 1 tablet (500 mg total) by mouth every 8 (eight) hours as needed for muscle spasms. Patient not taking: Reported on 05/12/2021 03/20/21   Benjiman Core, MD  predniSONE (DELTASONE) 20 MG tablet Take 3 tabs a day for 5 days, then: Take 2 times a day for 5 days ;then discontinue Patient not taking: Reported on 03/26/2021 03/10/21   Eustace Moore, MD  pregabalin (LYRICA) 50 MG capsule Take 225 mg by mouth 2 (two) times daily.     [provider]  promethazine (PHENERGAN) 25 MG tablet Take 1 tablet (25 mg total) by mouth every 6 (six) hours as needed for nausea or vomiting. Patient not taking: Reported on 05/12/2021 08/08/20   Gailen Shelter, PA    Family History Family History  Problem Relation Age of Onset   Gout Mother    Healthy Father    Diabetes Other     Social History Social History   Tobacco Use   Smoking status: Every Day    Packs/day: 0.50    Years: 20.00    Pack years: 10.00    Types: Cigarettes   Smokeless tobacco: Never  Vaping Use   Vaping Use: Never used  Substance Use Topics   Alcohol use: Yes    Comment: Very Rare   Drug use: Never  Allergies   Metronidazole, Flavoring agent, and Aspirin   Review of Systems Review of Systems  Gastrointestinal:  Positive for vomiting.  Neurological:  Positive for headaches.  All other systems reviewed and are negative.   Physical Exam Triage Vital Signs ED Triage Vitals  Enc Vitals Group     BP 05/12/21 1928 (!) 150/95     Pulse Rate 05/12/21 1928 100     Resp 05/12/21 1928 14     Temp 05/12/21 1928 98.1 F (36.7 C)     Temp Source 05/12/21 1928 Oral     SpO2 05/12/21 1928 97 %     Weight --      Height --      Head Circumference --      Peak Flow --      Pain Score 05/12/21 1929 10     Pain Loc --      Pain Edu? --      Excl. in GC? --    No data found.  Updated Vital Signs BP (!) 150/95 (BP Location: Left Arm)   Pulse 100   Temp 98.1 F (36.7 C) (Oral)   Resp 14    SpO2 97%   Physical Exam Vitals and nursing note reviewed.  Constitutional:      Appearance: She is well-developed and normal weight.  HENT:     Head: Normocephalic and atraumatic.     Mouth/Throat:     Mouth: Mucous membranes are moist.     Pharynx: Oropharynx is clear.  Eyes:     General: No visual field deficit.    Extraocular Movements: Extraocular movements intact.     Pupils: Pupils are equal, round, and reactive to light.  Cardiovascular:     Rate and Rhythm: Normal rate and regular rhythm.  Pulmonary:     Effort: Pulmonary effort is normal.     Breath sounds: Normal breath sounds.  Musculoskeletal:        General: Normal range of motion.     Cervical back: Normal range of motion and neck supple.  Skin:    General: Skin is warm and dry.  Neurological:     Mental Status: She is alert.     Cranial Nerves: No cranial nerve deficit or facial asymmetry.     Motor: No weakness.     Gait: Gait normal.     UC Treatments / Results  Labs (all labs ordered are listed, but only abnormal results are displayed) Labs Reviewed - No data to display  EKG   Radiology No results found.  Procedures Procedures (including critical care time)  Medications Ordered in UC Medications  ketorolac (TORADOL) injection 60 mg (60 mg Intramuscular Given 05/12/21 1954)  metoCLOPramide (REGLAN) injection 10 mg (10 mg Intramuscular Given 05/12/21 1955)    Initial Impression / Assessment and Plan / UC Course  I have reviewed the triage vital signs and the nursing notes.  Pertinent labs & imaging results that were available during my care of the patient were reviewed by me and considered in my medical decision making (see chart for details).     MDM: 1.  Headache disorder-IM Toradol 60 mg, IM Reglan 10 mg given once in clinic prior to discharge today; 2.  Nausea and vomiting-Rx'd Zofran. Advised patient may take Zofran daily, as needed for nausea.  Advised patient to adhere to bland/brat  diet for the next 3 to 5 days gradually returning to normal diet.  Patient discharged home, hemodynamically stable. Final Clinical Impressions(s) /  UC Diagnoses   Final diagnoses:  Headache disorder  Nausea and vomiting, unspecified vomiting type     Discharge Instructions      Advised patient may take Zofran daily, as needed for nausea.  Advised patient to adhere to bland/brat diet for the next 3 to 5 days gradually returning to normal diet.     ED Prescriptions     Medication Sig Dispense Auth. Provider   ondansetron (ZOFRAN ODT) 4 MG disintegrating tablet Take 1 tablet (4 mg total) by mouth every 8 (eight) hours as needed for nausea or vomiting. 24 tablet Trevor Iha, FNP      PDMP not reviewed this encounter.   Trevor Iha, FNP 05/12/21 2011

## 2021-05-12 NOTE — ED Triage Notes (Signed)
Pt presents with HA x3days, emesis, weakness, and poor appetite

## 2023-01-20 IMAGING — DX DG CHEST 2V
2 series · 2 of 2 positions shown · non-contrast
Comparison: 10/04/2020

CLINICAL DATA: Cough and short of breath

EXAM:
CHEST - 2 VIEW

[chest pa]
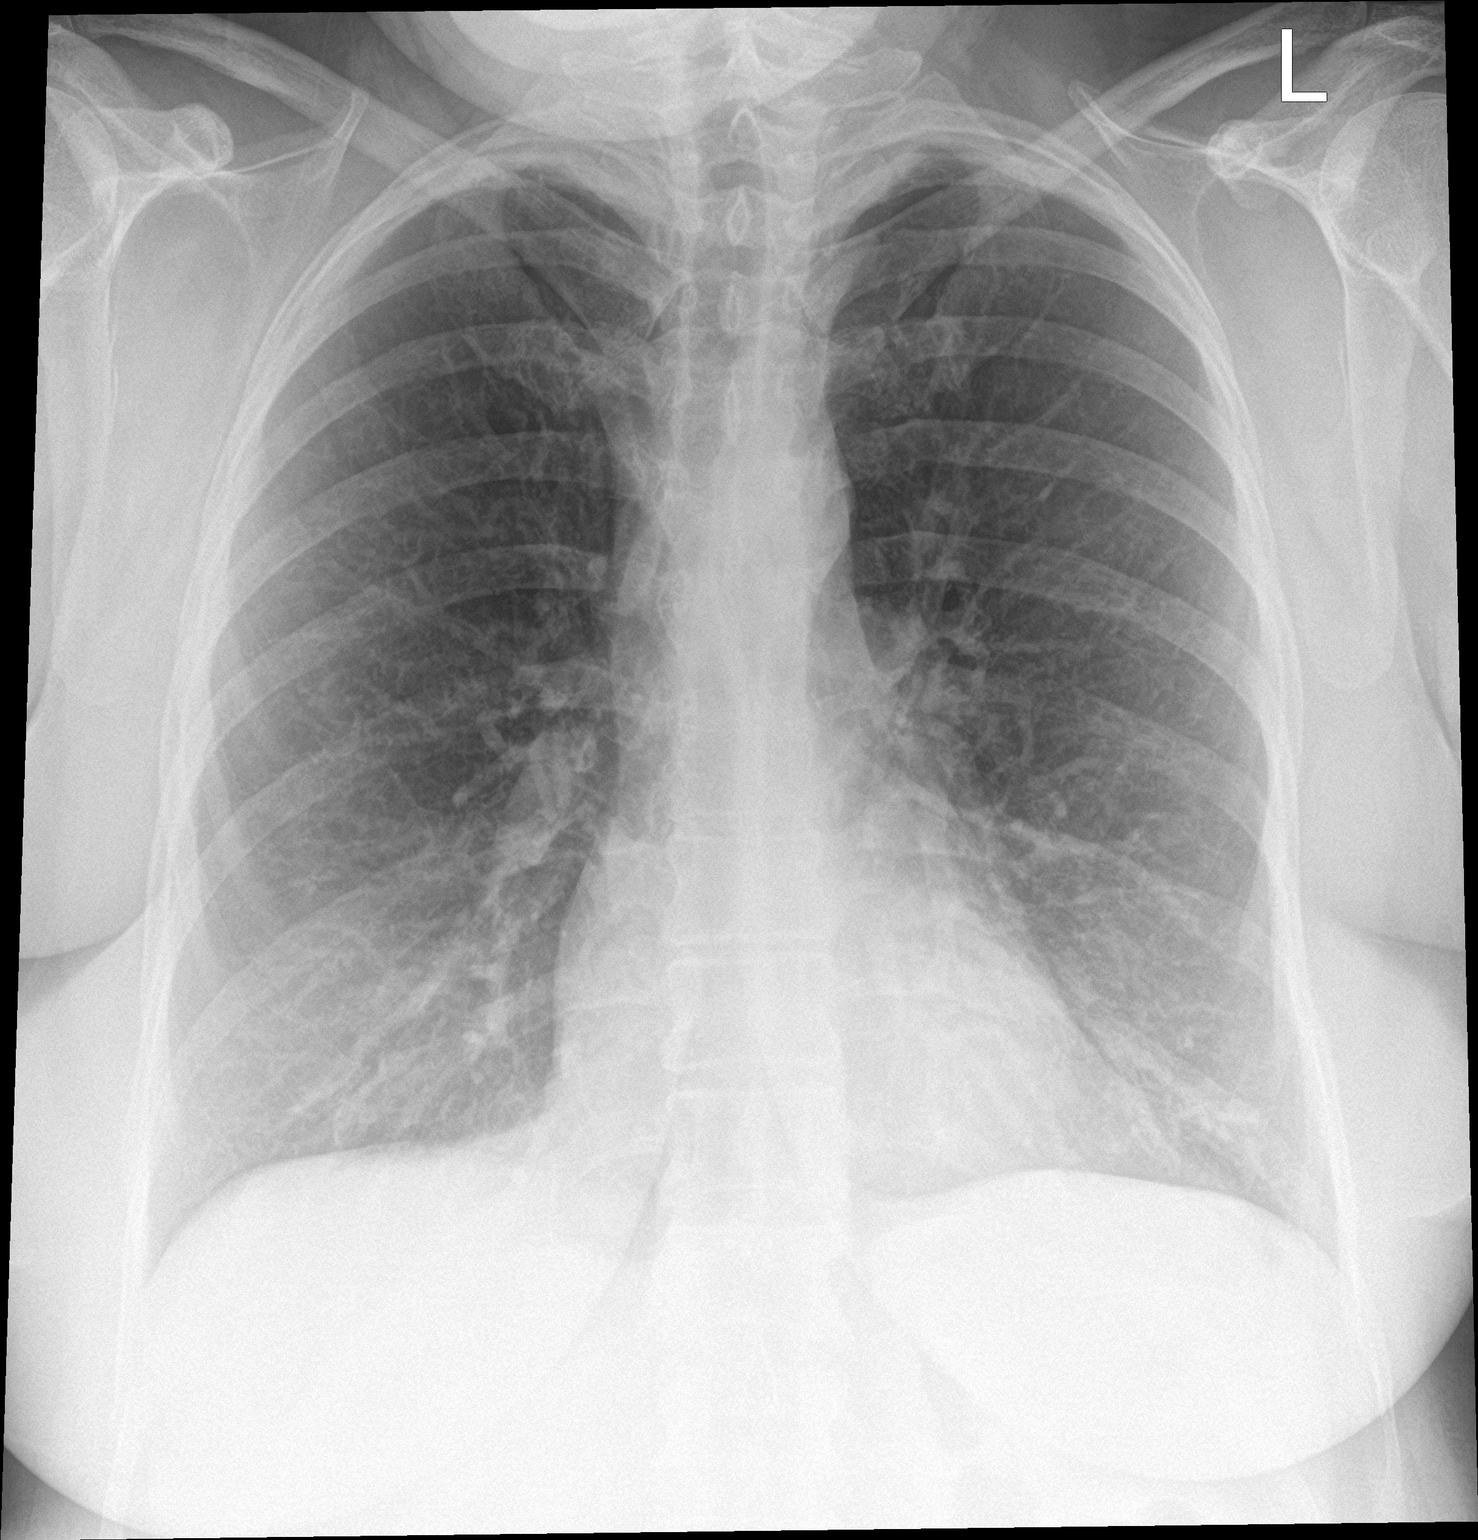

[chest lat]
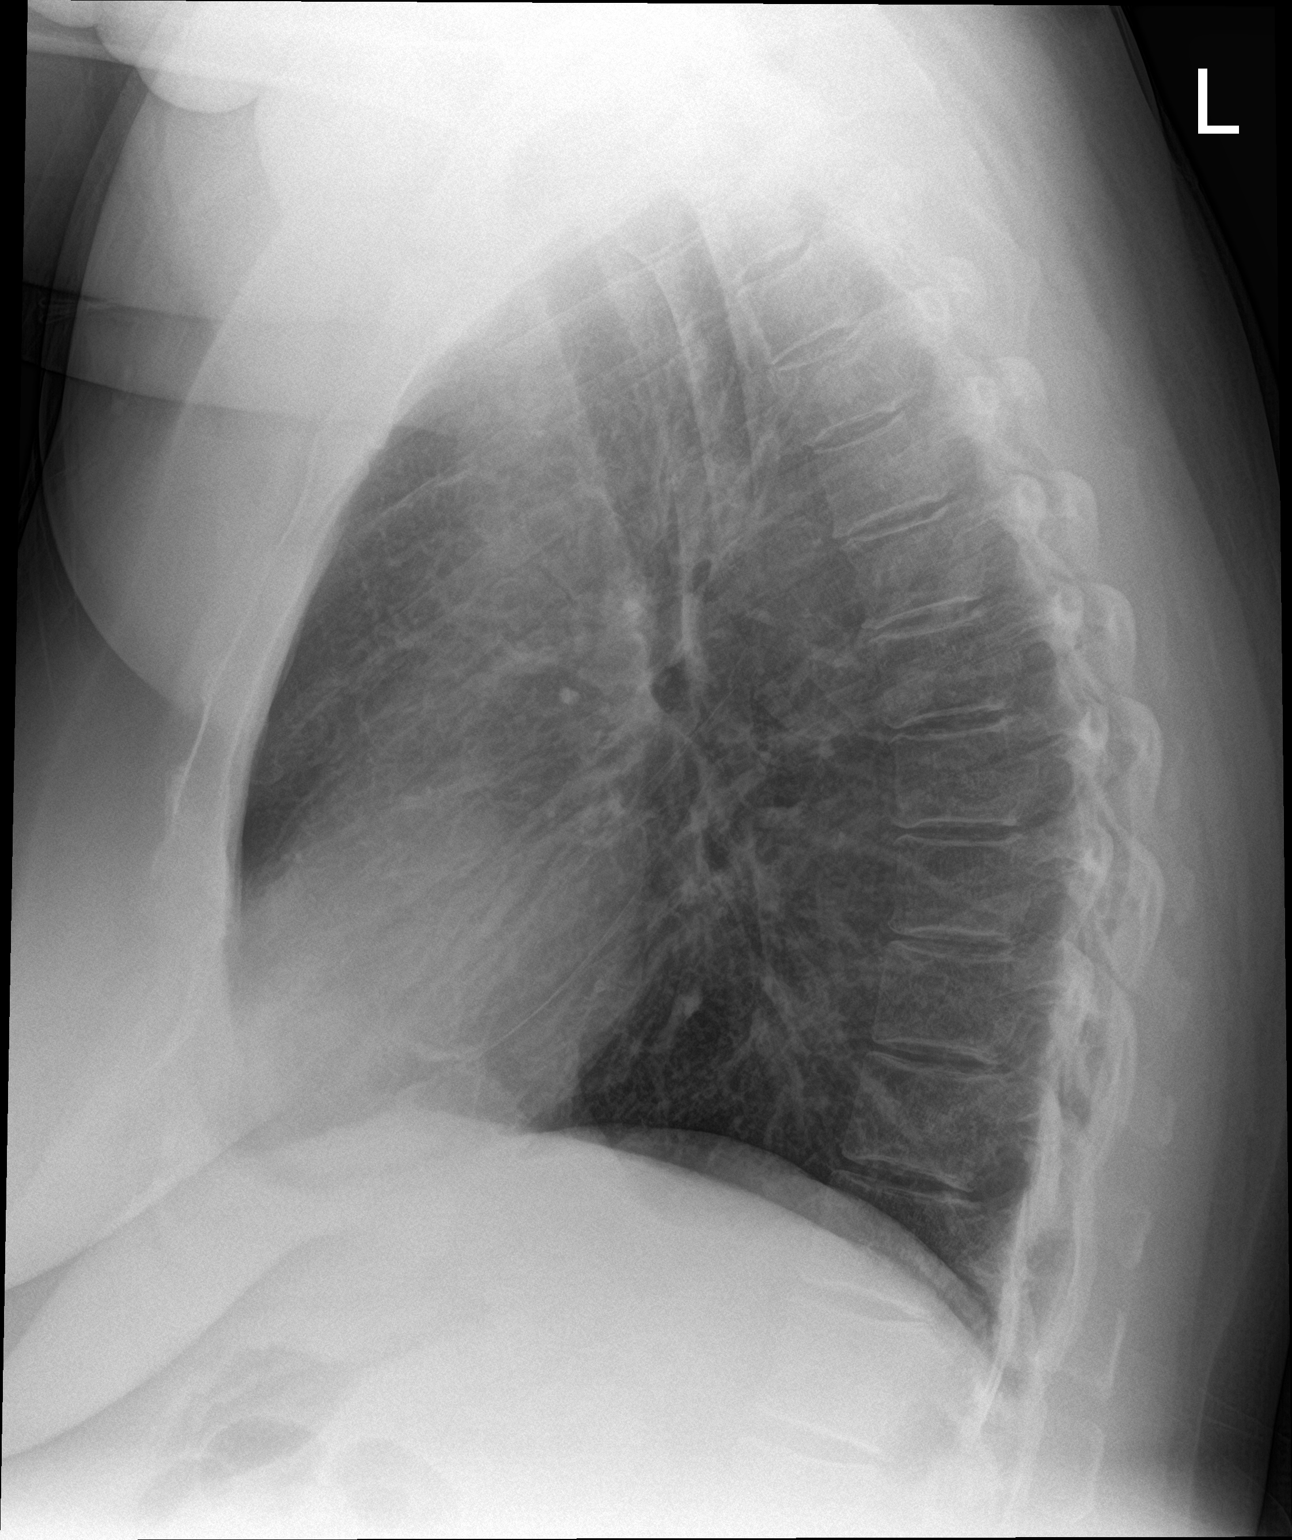

[2 of 2 positions shown; findings below may reference images not displayed]

FINDINGS: The heart size and mediastinal contours are within normal limits.
Both lungs are clear. The visualized skeletal structures are
unremarkable.
IMPRESSION: No active cardiopulmonary disease.

## 2024-04-17 DEATH — deceased
